# Patient Record
Sex: Female | Born: 1935 | Race: White | Hispanic: No | Marital: Married | Smoking: Never smoker
Health system: Southern US, Community
[De-identification: ages and names within clinical notes are randomized; demographics above are authoritative.]

## PROBLEM LIST (undated history)

## (undated) DIAGNOSIS — I10 Essential (primary) hypertension: Secondary | ICD-10-CM

## (undated) DIAGNOSIS — H409 Unspecified glaucoma: Secondary | ICD-10-CM

## (undated) DIAGNOSIS — F0390 Unspecified dementia without behavioral disturbance: Secondary | ICD-10-CM

## (undated) DIAGNOSIS — K5900 Constipation, unspecified: Secondary | ICD-10-CM

## (undated) DIAGNOSIS — C801 Malignant (primary) neoplasm, unspecified: Secondary | ICD-10-CM

## (undated) DIAGNOSIS — M889 Osteitis deformans of unspecified bone: Secondary | ICD-10-CM

## (undated) HISTORY — PX: HIP ARTHROPLASTY: SHX981

---

## 1997-12-20 ENCOUNTER — Emergency Department (HOSPITAL_COMMUNITY): Admission: EM | Admit: 1997-12-20 | Discharge: 1997-12-20 | Payer: Self-pay | Admitting: Emergency Medicine

## 1999-10-30 ENCOUNTER — Encounter: Payer: Self-pay | Admitting: Orthopedic Surgery

## 1999-10-30 ENCOUNTER — Encounter: Admission: RE | Admit: 1999-10-30 | Discharge: 1999-10-30 | Payer: Self-pay | Admitting: Orthopedic Surgery

## 1999-10-31 ENCOUNTER — Encounter: Admission: RE | Admit: 1999-10-31 | Discharge: 1999-10-31 | Payer: Self-pay | Admitting: Orthopedic Surgery

## 1999-10-31 ENCOUNTER — Encounter: Payer: Self-pay | Admitting: Orthopedic Surgery

## 1999-11-07 ENCOUNTER — Encounter: Payer: Self-pay | Admitting: Orthopedic Surgery

## 1999-11-07 ENCOUNTER — Encounter: Admission: RE | Admit: 1999-11-07 | Discharge: 1999-11-07 | Payer: Self-pay | Admitting: Orthopedic Surgery

## 1999-11-14 ENCOUNTER — Emergency Department (HOSPITAL_COMMUNITY): Admission: EM | Admit: 1999-11-14 | Discharge: 1999-11-14 | Payer: Self-pay | Admitting: Emergency Medicine

## 1999-11-14 ENCOUNTER — Encounter: Payer: Self-pay | Admitting: Emergency Medicine

## 2000-01-28 ENCOUNTER — Encounter: Payer: Self-pay | Admitting: Orthopedic Surgery

## 2000-02-04 ENCOUNTER — Encounter: Payer: Self-pay | Admitting: Orthopedic Surgery

## 2000-02-04 ENCOUNTER — Inpatient Hospital Stay (HOSPITAL_COMMUNITY): Admission: RE | Admit: 2000-02-04 | Discharge: 2000-02-08 | Payer: Self-pay | Admitting: Orthopedic Surgery

## 2000-02-07 ENCOUNTER — Encounter: Payer: Self-pay | Admitting: Orthopedic Surgery

## 2000-02-08 ENCOUNTER — Inpatient Hospital Stay (HOSPITAL_COMMUNITY)
Admission: RE | Admit: 2000-02-08 | Discharge: 2000-02-14 | Payer: Self-pay | Admitting: Physical Medicine & Rehabilitation

## 2003-09-19 ENCOUNTER — Emergency Department (HOSPITAL_COMMUNITY): Admission: EM | Admit: 2003-09-19 | Discharge: 2003-09-19 | Payer: Self-pay | Admitting: Emergency Medicine

## 2007-08-05 ENCOUNTER — Inpatient Hospital Stay (HOSPITAL_COMMUNITY): Admission: RE | Admit: 2007-08-05 | Discharge: 2007-08-11 | Payer: Self-pay | Admitting: Surgery

## 2007-08-05 ENCOUNTER — Encounter (INDEPENDENT_AMBULATORY_CARE_PROVIDER_SITE_OTHER): Payer: Self-pay | Admitting: Surgery

## 2007-08-19 ENCOUNTER — Ambulatory Visit: Payer: Self-pay | Admitting: Hematology and Oncology

## 2007-09-25 ENCOUNTER — Ambulatory Visit (HOSPITAL_COMMUNITY): Admission: RE | Admit: 2007-09-25 | Discharge: 2007-09-25 | Payer: Self-pay | Admitting: Hematology and Oncology

## 2007-09-25 LAB — CBC WITH DIFFERENTIAL/PLATELET
Basophils Absolute: 0.1 10*3/uL (ref 0.0–0.1)
Eosinophils Absolute: 0.1 10*3/uL (ref 0.0–0.5)
HCT: 42.3 % (ref 34.8–46.6)
HGB: 14.7 g/dL (ref 11.6–15.9)
LYMPH%: 19.8 % (ref 14.0–48.0)
MCHC: 34.7 g/dL (ref 32.0–36.0)
MONO#: 0.4 10*3/uL (ref 0.1–0.9)
NEUT%: 72 % (ref 39.6–76.8)
Platelets: 195 10*3/uL (ref 145–400)
WBC: 6.8 10*3/uL (ref 3.9–10.0)
lymph#: 1.4 10*3/uL (ref 0.9–3.3)

## 2007-09-25 LAB — COMPREHENSIVE METABOLIC PANEL
BUN: 14 mg/dL (ref 6–23)
CO2: 28 mEq/L (ref 19–32)
Calcium: 10.7 mg/dL — ABNORMAL HIGH (ref 8.4–10.5)
Chloride: 104 mEq/L (ref 96–112)
Creatinine, Ser: 0.73 mg/dL (ref 0.40–1.20)
Glucose, Bld: 117 mg/dL — ABNORMAL HIGH (ref 70–99)
Total Bilirubin: 0.8 mg/dL (ref 0.3–1.2)

## 2007-10-15 ENCOUNTER — Ambulatory Visit (HOSPITAL_COMMUNITY): Admission: RE | Admit: 2007-10-15 | Discharge: 2007-10-15 | Payer: Self-pay | Admitting: Hematology and Oncology

## 2007-10-19 ENCOUNTER — Ambulatory Visit: Payer: Self-pay | Admitting: Hematology and Oncology

## 2007-10-21 LAB — CBC WITH DIFFERENTIAL/PLATELET
Basophils Absolute: 0 10*3/uL (ref 0.0–0.1)
EOS%: 2.8 % (ref 0.0–7.0)
Eosinophils Absolute: 0.2 10*3/uL (ref 0.0–0.5)
HCT: 40.6 % (ref 34.8–46.6)
HGB: 14.1 g/dL (ref 11.6–15.9)
MCH: 32.9 pg (ref 26.0–34.0)
MONO#: 0.3 10*3/uL (ref 0.1–0.9)
NEUT#: 3.5 10*3/uL (ref 1.5–6.5)
NEUT%: 66 % (ref 39.6–76.8)
RDW: 14.8 % — ABNORMAL HIGH (ref 11.3–14.5)
WBC: 5.4 10*3/uL (ref 3.9–10.0)
lymph#: 1.3 10*3/uL (ref 0.9–3.3)

## 2007-10-21 LAB — COMPREHENSIVE METABOLIC PANEL
AST: 20 U/L (ref 0–37)
Albumin: 4.6 g/dL (ref 3.5–5.2)
BUN: 17 mg/dL (ref 6–23)
CO2: 25 mEq/L (ref 19–32)
Calcium: 10.3 mg/dL (ref 8.4–10.5)
Chloride: 101 mEq/L (ref 96–112)
Creatinine, Ser: 0.81 mg/dL (ref 0.40–1.20)
Glucose, Bld: 169 mg/dL — ABNORMAL HIGH (ref 70–99)
Potassium: 4.5 mEq/L (ref 3.5–5.3)

## 2007-11-12 LAB — CBC WITH DIFFERENTIAL/PLATELET
BASO%: 0.5 % (ref 0.0–2.0)
Basophils Absolute: 0 10*3/uL (ref 0.0–0.1)
EOS%: 2.7 % (ref 0.0–7.0)
HCT: 39.3 % (ref 34.8–46.6)
HGB: 13.9 g/dL (ref 11.6–15.9)
MCH: 34.5 pg — ABNORMAL HIGH (ref 26.0–34.0)
MONO#: 0.4 10*3/uL (ref 0.1–0.9)
RDW: 19.1 % — ABNORMAL HIGH (ref 11.3–14.5)
WBC: 5.7 10*3/uL (ref 3.9–10.0)
lymph#: 1.3 10*3/uL (ref 0.9–3.3)

## 2007-11-12 LAB — COMPREHENSIVE METABOLIC PANEL
ALT: 14 U/L (ref 0–35)
BUN: 20 mg/dL (ref 6–23)
CO2: 20 mEq/L (ref 19–32)
Creatinine, Ser: 0.83 mg/dL (ref 0.40–1.20)
Total Bilirubin: 0.9 mg/dL (ref 0.3–1.2)

## 2007-12-02 LAB — COMPREHENSIVE METABOLIC PANEL
AST: 19 U/L (ref 0–37)
Albumin: 4.5 g/dL (ref 3.5–5.2)
BUN: 14 mg/dL (ref 6–23)
Calcium: 10.4 mg/dL (ref 8.4–10.5)
Chloride: 105 mEq/L (ref 96–112)
Creatinine, Ser: 0.8 mg/dL (ref 0.40–1.20)
Glucose, Bld: 133 mg/dL — ABNORMAL HIGH (ref 70–99)
Potassium: 4.7 mEq/L (ref 3.5–5.3)

## 2007-12-02 LAB — CBC WITH DIFFERENTIAL/PLATELET
Basophils Absolute: 0 10*3/uL (ref 0.0–0.1)
EOS%: 2.2 % (ref 0.0–7.0)
Eosinophils Absolute: 0.1 10*3/uL (ref 0.0–0.5)
HCT: 39.5 % (ref 34.8–46.6)
HGB: 14 g/dL (ref 11.6–15.9)
MCH: 35.9 pg — ABNORMAL HIGH (ref 26.0–34.0)
MCV: 101.5 fL — ABNORMAL HIGH (ref 81.0–101.0)
NEUT#: 5 10*3/uL (ref 1.5–6.5)
NEUT%: 72.1 % (ref 39.6–76.8)
lymph#: 1.3 10*3/uL (ref 0.9–3.3)

## 2007-12-18 ENCOUNTER — Ambulatory Visit: Payer: Self-pay | Admitting: Hematology and Oncology

## 2008-01-12 LAB — CBC WITH DIFFERENTIAL/PLATELET
Basophils Absolute: 0 10*3/uL (ref 0.0–0.1)
Eosinophils Absolute: 0.2 10*3/uL (ref 0.0–0.5)
HCT: 40 % (ref 34.8–46.6)
HGB: 13.9 g/dL (ref 11.6–15.9)
LYMPH%: 18 % (ref 14.0–48.0)
MCV: 105.6 fL — ABNORMAL HIGH (ref 81.0–101.0)
MONO%: 6.8 % (ref 0.0–13.0)
NEUT#: 4.7 10*3/uL (ref 1.5–6.5)
Platelets: 225 10*3/uL (ref 145–400)

## 2008-01-12 LAB — COMPREHENSIVE METABOLIC PANEL
Albumin: 4.6 g/dL (ref 3.5–5.2)
Alkaline Phosphatase: 189 U/L — ABNORMAL HIGH (ref 39–117)
BUN: 26 mg/dL — ABNORMAL HIGH (ref 6–23)
Glucose, Bld: 120 mg/dL — ABNORMAL HIGH (ref 70–99)
Potassium: 4.7 mEq/L (ref 3.5–5.3)
Total Bilirubin: 0.9 mg/dL (ref 0.3–1.2)

## 2008-01-27 ENCOUNTER — Ambulatory Visit: Payer: Self-pay | Admitting: Hematology and Oncology

## 2008-01-29 LAB — CBC WITH DIFFERENTIAL/PLATELET
Basophils Absolute: 0 10*3/uL (ref 0.0–0.1)
Eosinophils Absolute: 0.2 10*3/uL (ref 0.0–0.5)
LYMPH%: 24.2 % (ref 14.0–48.0)
MCH: 36.8 pg — ABNORMAL HIGH (ref 26.0–34.0)
MCV: 106.3 fL — ABNORMAL HIGH (ref 81.0–101.0)
MONO%: 7.1 % (ref 0.0–13.0)
NEUT#: 4.2 10*3/uL (ref 1.5–6.5)
Platelets: 200 10*3/uL (ref 145–400)
RBC: 3.73 10*6/uL (ref 3.70–5.32)

## 2008-01-29 LAB — COMPREHENSIVE METABOLIC PANEL
Alkaline Phosphatase: 171 U/L — ABNORMAL HIGH (ref 39–117)
BUN: 25 mg/dL — ABNORMAL HIGH (ref 6–23)
Glucose, Bld: 117 mg/dL — ABNORMAL HIGH (ref 70–99)
Total Bilirubin: 0.8 mg/dL (ref 0.3–1.2)

## 2008-03-11 ENCOUNTER — Ambulatory Visit (HOSPITAL_COMMUNITY): Admission: RE | Admit: 2008-03-11 | Discharge: 2008-03-11 | Payer: Self-pay | Admitting: Hematology and Oncology

## 2008-03-15 ENCOUNTER — Ambulatory Visit: Payer: Self-pay | Admitting: Hematology and Oncology

## 2008-03-15 LAB — CBC WITH DIFFERENTIAL/PLATELET
BASO%: 0.3 % (ref 0.0–2.0)
EOS%: 2.4 % (ref 0.0–7.0)
Eosinophils Absolute: 0.1 10*3/uL (ref 0.0–0.5)
LYMPH%: 22 % (ref 14.0–49.7)
MCH: 36.5 pg — ABNORMAL HIGH (ref 25.1–34.0)
MCHC: 35.1 g/dL (ref 31.5–36.0)
MCV: 103.9 fL — ABNORMAL HIGH (ref 79.5–101.0)
MONO%: 5.8 % (ref 0.0–14.0)
Platelets: 177 10*3/uL (ref 145–400)
RBC: 3.95 10*6/uL (ref 3.70–5.45)
RDW: 16.2 % — ABNORMAL HIGH (ref 11.2–14.5)

## 2008-03-15 LAB — COMPREHENSIVE METABOLIC PANEL
AST: 26 U/L (ref 0–37)
Alkaline Phosphatase: 186 U/L — ABNORMAL HIGH (ref 39–117)
Glucose, Bld: 115 mg/dL — ABNORMAL HIGH (ref 70–99)
Sodium: 138 mEq/L (ref 135–145)
Total Bilirubin: 0.8 mg/dL (ref 0.3–1.2)
Total Protein: 7.2 g/dL (ref 6.0–8.3)

## 2008-07-12 ENCOUNTER — Ambulatory Visit: Payer: Self-pay | Admitting: Hematology and Oncology

## 2008-07-14 ENCOUNTER — Ambulatory Visit (HOSPITAL_COMMUNITY): Admission: RE | Admit: 2008-07-14 | Discharge: 2008-07-14 | Payer: Self-pay | Admitting: Hematology and Oncology

## 2008-07-14 LAB — CBC WITH DIFFERENTIAL/PLATELET
BASO%: 0.3 % (ref 0.0–2.0)
Eosinophils Absolute: 0.1 10*3/uL (ref 0.0–0.5)
HCT: 42.7 % (ref 34.8–46.6)
LYMPH%: 19.4 % (ref 14.0–49.7)
MCHC: 35.3 g/dL (ref 31.5–36.0)
MONO#: 0.3 10*3/uL (ref 0.1–0.9)
NEUT%: 72.8 % (ref 38.4–76.8)
Platelets: 173 10*3/uL (ref 145–400)
WBC: 6.4 10*3/uL (ref 3.9–10.3)

## 2008-07-14 LAB — COMPREHENSIVE METABOLIC PANEL
ALT: 21 U/L (ref 0–35)
AST: 24 U/L (ref 0–37)
Albumin: 4.4 g/dL (ref 3.5–5.2)
Alkaline Phosphatase: 169 U/L — ABNORMAL HIGH (ref 39–117)
BUN: 17 mg/dL (ref 6–23)
CO2: 25 mEq/L (ref 19–32)
Calcium: 10.2 mg/dL (ref 8.4–10.5)
Glucose, Bld: 102 mg/dL — ABNORMAL HIGH (ref 70–99)
Total Protein: 7.4 g/dL (ref 6.0–8.3)

## 2009-01-31 ENCOUNTER — Ambulatory Visit: Payer: Self-pay | Admitting: Hematology and Oncology

## 2009-01-31 ENCOUNTER — Ambulatory Visit (HOSPITAL_COMMUNITY): Admission: RE | Admit: 2009-01-31 | Discharge: 2009-01-31 | Payer: Self-pay | Admitting: Hematology and Oncology

## 2009-01-31 LAB — CBC WITH DIFFERENTIAL/PLATELET
BASO%: 0.2 % (ref 0.0–2.0)
Basophils Absolute: 0 10*3/uL (ref 0.0–0.1)
Eosinophils Absolute: 0.1 10*3/uL (ref 0.0–0.5)
HCT: 45.1 % (ref 34.8–46.6)
HGB: 15.4 g/dL (ref 11.6–15.9)
LYMPH%: 18.3 % (ref 14.0–49.7)
MCHC: 34.2 g/dL (ref 31.5–36.0)
MONO#: 0.3 10*3/uL (ref 0.1–0.9)
NEUT#: 5.3 10*3/uL (ref 1.5–6.5)
NEUT%: 74.6 % (ref 38.4–76.8)
Platelets: 193 10*3/uL (ref 145–400)
WBC: 7.1 10*3/uL (ref 3.9–10.3)
lymph#: 1.3 10*3/uL (ref 0.9–3.3)

## 2009-01-31 LAB — COMPREHENSIVE METABOLIC PANEL
ALT: 19 U/L (ref 0–35)
CO2: 28 mEq/L (ref 19–32)
Calcium: 10.4 mg/dL (ref 8.4–10.5)
Chloride: 103 mEq/L (ref 96–112)
Creatinine, Ser: 0.88 mg/dL (ref 0.40–1.20)
Glucose, Bld: 117 mg/dL — ABNORMAL HIGH (ref 70–99)

## 2009-01-31 LAB — CEA: CEA: 1 ng/mL (ref 0.0–5.0)

## 2009-08-01 ENCOUNTER — Ambulatory Visit: Payer: Self-pay | Admitting: Hematology and Oncology

## 2009-08-03 ENCOUNTER — Ambulatory Visit (HOSPITAL_COMMUNITY): Admission: RE | Admit: 2009-08-03 | Discharge: 2009-08-03 | Payer: Self-pay | Admitting: Hematology and Oncology

## 2009-08-03 LAB — COMPREHENSIVE METABOLIC PANEL
ALT: 22 U/L (ref 0–35)
AST: 29 U/L (ref 0–37)
Albumin: 4.5 g/dL (ref 3.5–5.2)
Calcium: 10.5 mg/dL (ref 8.4–10.5)
Chloride: 109 mEq/L (ref 96–112)
Potassium: 4.3 mEq/L (ref 3.5–5.3)

## 2009-08-03 LAB — CBC WITH DIFFERENTIAL/PLATELET
BASO%: 0.3 % (ref 0.0–2.0)
Basophils Absolute: 0 10*3/uL (ref 0.0–0.1)
EOS%: 2.6 % (ref 0.0–7.0)
HGB: 14.9 g/dL (ref 11.6–15.9)
MCH: 32.9 pg (ref 25.1–34.0)
MCHC: 35.1 g/dL (ref 31.5–36.0)
MONO#: 0.3 10*3/uL (ref 0.1–0.9)
RDW: 12.7 % (ref 11.2–14.5)
WBC: 5.8 10*3/uL (ref 3.9–10.3)
lymph#: 1.2 10*3/uL (ref 0.9–3.3)

## 2010-01-29 ENCOUNTER — Ambulatory Visit: Payer: Self-pay | Admitting: Hematology and Oncology

## 2010-01-31 LAB — COMPREHENSIVE METABOLIC PANEL
ALT: 23 U/L (ref 0–35)
AST: 27 U/L (ref 0–37)
Albumin: 5 g/dL (ref 3.5–5.2)
Alkaline Phosphatase: 189 U/L — ABNORMAL HIGH (ref 39–117)
BUN: 16 mg/dL (ref 6–23)
CO2: 22 mEq/L (ref 19–32)
Calcium: 10.8 mg/dL — ABNORMAL HIGH (ref 8.4–10.5)
Chloride: 106 mEq/L (ref 96–112)
Creatinine, Ser: 0.83 mg/dL (ref 0.40–1.20)
Glucose, Bld: 116 mg/dL — ABNORMAL HIGH (ref 70–99)
Potassium: 4.7 mEq/L (ref 3.5–5.3)
Sodium: 141 mEq/L (ref 135–145)
Total Bilirubin: 0.7 mg/dL (ref 0.3–1.2)
Total Protein: 7.6 g/dL (ref 6.0–8.3)

## 2010-01-31 LAB — CBC WITH DIFFERENTIAL/PLATELET
BASO%: 0.2 % (ref 0.0–2.0)
Basophils Absolute: 0 10*3/uL (ref 0.0–0.1)
EOS%: 2.8 % (ref 0.0–7.0)
Eosinophils Absolute: 0.2 10*3/uL (ref 0.0–0.5)
HCT: 45.2 % (ref 34.8–46.6)
HGB: 15.4 g/dL (ref 11.6–15.9)
LYMPH%: 23.4 % (ref 14.0–49.7)
MCH: 32.2 pg (ref 25.1–34.0)
MCHC: 34 g/dL (ref 31.5–36.0)
MCV: 94.8 fL (ref 79.5–101.0)
MONO#: 0.4 10*3/uL (ref 0.1–0.9)
MONO%: 5.4 % (ref 0.0–14.0)
NEUT#: 4.5 10*3/uL (ref 1.5–6.5)
NEUT%: 68.2 % (ref 38.4–76.8)
Platelets: 194 10*3/uL (ref 145–400)
RBC: 4.77 10*6/uL (ref 3.70–5.45)
RDW: 13 % (ref 11.2–14.5)
WBC: 6.6 10*3/uL (ref 3.9–10.3)
lymph#: 1.6 10*3/uL (ref 0.9–3.3)

## 2010-01-31 LAB — CEA: CEA: 0.8 ng/mL (ref 0.0–5.0)

## 2010-02-04 ENCOUNTER — Encounter: Payer: Self-pay | Admitting: Hematology and Oncology

## 2010-05-29 NOTE — Discharge Summary (Signed)
[name], [name]               ACCOUNT NO.:  [id number]   MEDICAL RECORD NO.:  [id number]          PATIENT TYPE:  INP   LOCATION:  1530                         FACILITY:  [facility]   PHYSICIAN:  Maisie Fus A. Cornett, M.D.DATE OF BIRTH:  1923/01/14   DATE OF ADMISSION:  08/05/2007  DATE OF DISCHARGE:  08/11/2007                               DISCHARGE SUMMARY   ADMITTING DIAGNOSIS:  Sigmoid colon cancer.   DISCHARGE DIAGNOSIS:  Stage III sigmoid colon cancer.   PROCEDURE PERFORMED:  Laparoscopic-assisted sigmoid colectomy.   BRIEF HISTORY:  The patient is an 77 year old female who was admitted on  August 05, 2007 with history of Paget's disease for a sigmoid colectomy  due to a stage III sigmoid colon cancer.  Please see op note for details  and history and physical.   HOSPITAL COURSE:  The patient did very well.  Her bowel function  returned by postoperative day #2.  Her diet was advanced.  She had some  needs with physical therapy in ambulation and this was worked on during  her hospital stay.  Her hemoglobin remained stable.  She had bowel  function returning and was tolerating her diet.  Her wound was clean,  dry, intact.  Abdomen was soft and nontender.  She was discharged home  on postoperative day #6 in satisfactory condition.  Final pathology was  a T3N1 with 1 of 23 lymph nodes involved on the resection.  Margins were  clear.   DISCHARGE INSTRUCTIONS:  She will follow up with me in 3 weeks.  She  will have home physical therapy and home health aides to help her when  she goes home.  I will make arrangements for an outpatient oncology  visit.  Her diet will be a soft mechanical diet, low fiber, and I will  advance this once I see her back in the office.  She may shower.  No  driving or heavy exertional work until I see her back in 3 weeks.   CONDITION ON DISCHARGE:  Improved.      Thomas A. Cornett, M.D.  Electronically Signed     TAC/MEDQ  D:  08/11/2007  T:   08/11/2007  Job:  [id number]

## 2010-05-29 NOTE — Op Note (Signed)
[name], [name]               ACCOUNT NO.:  [id number]   MEDICAL RECORD NO.:  [id number]          PATIENT TYPE:  INP   LOCATION:  0001                         FACILITY:  [facility]   PHYSICIAN:  Thomas A. Cornett, M.D.DATE OF BIRTH:  05-24-1922   DATE OF PROCEDURE:  08/05/2007  DATE OF DISCHARGE:                               OPERATIVE REPORT   PREOPERATIVE DIAGNOSIS:  Sigmoid colon cancer.   POSTOPERATIVE DIAGNOSIS:  Sigmoid colon cancer.   PROCEDURE:  Laparoscopic-assisted sigmoid colectomy.   SURGEON:  Thomas Cornett.   ASSISTANT:  Glenna Fellows, MD.   ANESTHESIA:  General endotracheal anesthesia, 0.25% Sensorcaine local.   EBL:  30 mL.   SPECIMEN:  Sigmoid colon, large obstructing fungating mass to pathology.   DRAINS:  None.   INDICATIONS FOR PROCEDURE:  The patient is an 77 year old female with a  large obstructing sigmoid colon mass.  She presents today for  laparoscopic-assisted resection of the sigmoid colon mass.  Informed  consent was obtained prior to the procedure in the office.  Please see  office notes for this.   DESCRIPTION OF PROCEDURE:  The patient was brought to the operating room  and placed supine.  After induction of general anesthesia, she was  placed in lithotomy.  Both her extremities were appropriately padded.  She did have a somewhat stiff right hip and right knee, so we were  careful to support this appropriately and try to keep her leg relatively  straight but just enough to have access to the anal canal if needed if a  subsequent stapling device needed to be inserted there.  The left leg  was also kept relatively straight to keep tension off the hip and knee  since she had a hip replacement on the right side and significant  arthritis.  I checked this myself and kept this well padded and I did  not see any undue tension.  I was careful not to put any excessive  tension while in lithotomy.  Both arms were also tucked.  After  induction  of general anesthesia, the abdomen and perineum were prepped  and draped in a sterile fashion.  A 5-mm Optiview was used and access to  the abdominal cavity was gained through the right lower quadrant.  This  was done under direct vision, with no evidence of bowel injury.  We  insufflated 15 mmHg of CO2.  I then placed a second 5-mm port just to  the left of midline in the left upper quadrant.  A third 5-mm port was  placed halfway between the pubic symphysis and the umbilicus under  direct vision.  After achieving insufflation I then was able to examine  the sigmoid colon with the patient rolled to her right and in  Trendelenburg.  The sigmoid colon was extremely redundant.  I was able  to identify the tumor easily and this was in the mid sigmoid colon.  It  was hard and near obstructing it looked like.  I did not see any  significant small bowel dilation.  There was some mild colon dilation  proximal to it.  This area was extremely mobile.  The mesentery was  stretched and it was quite long.  I was able to manipulate the tumor and  actually pulled it all toward the patient's right lower quadrant and  felt this was adequately mobilized, not having to do any significant  mobilization.  At this point, since this was so mobile, I elected to go  ahead and place my abdominal incision to extract this specimen.  I  measured roughly 7 cm through her old scar.  An incision was made in the  lower midline abdomen and I removed the 5-mm port that was there.  We  dissected down through her abdominal wall and placed a wound protector.  I then able to reach and grab the tumor and pull this out quite easily.  There was ample proximal length and there was ample distal length.  I  selected a point approximately 5 cm proximal to the tumor and  approximately 5 cm distal to the tumor and fired a GIA 75 stapling  device across both areas.  The mesentery was quite stretched well away  from the ureter.  A  LigaSure was used to take the mesentery with it down  to the root of the mesentery I felt.  I did not palpate any significant  adenopathy within the mesentery.  Once we resected this, I oriented it  and passed it off the field.  I also placed orienting sutures in the  proximal and distal ends of bowel so we would not get confused and have  the bowel be twisted.  We then performed a side-to-side functional end-  to-end anastomosis of the proximal and distal colon using a GIA 75  stapling device.  The enterotomy was closed using a TA-60.  I oversewed  some bleeding along the staple line with 3-0 silk.  A stitch was placed  in the crotch of the anastomosis of 3-0 silk.  I was able to get at  least get 2 fingerbreadths through the anastomosis and this felt widely  patent.  I then irrigated out the abdominal cavity and dropped the  specimen back.  I then closed the fascia with a running #1 PDS.  I  irrigated the subcutaneous tissues with saline and closed the skin with  staples.  I reinsufflated the abdominal cavity and re-placed my 5-mm  scope.  The liver showed no signs of any mass or evidence of metastatic  disease.  She had no evidence of carcinomatosis and no evidence of  peritoneal studding.  I then with a grasper was able to trace the  proximal colon down to the anastomosis and then to the distal colon.  I  did not see any evidence of where the bowel was twisted or kinked.  It  appeared to be widely patent without any undue tension on the  anastomosis.  There appeared to be no evidence of bleeding.  I irrigated  out the area of the anastomosis and, again, felt no evidence of  bleeding.  This was probably at about 30-40 cm from the anal verge it  looked like.  At this point in time I suctioned out the excess  irrigation.  The anastomosis looked well vascularized and showed no  evidence of tension.  It laid quite nicely and I did not see any  evidence of where this was twisted.  Any excess  irrigation I suctioned  out.  I did not see any evidence of injury to the remainder of the small  bowel nor colon upon inspecting the abdominal cavity.  Her liver, again,  appeared normal without signs of any metastatic disease.  At this point  in time I went ahead and withdrew the laparoscope and allowed the CO2 to  escape.  We then closed the skin incisions with staples.  Dry dressings  were applied.  All final counts of sponge, needle and instruments were  found to be correct at this portion of the case.  The patient is  enrolled in the Ashley study.  All final counts were counted a second  time and found to be correct.  The patient was then awakened and taken  out of lithotomy and taken to recovery in satisfactory condition.      Thomas A. Cornett, M.D.  Electronically Signed     TAC/MEDQ  D:  08/05/2007  T:  08/05/2007  Job:  [id number]   cc:   Gildardo Cranker, MD   Graylin Shiver, M.D.  Fax: [phone]

## 2010-06-01 NOTE — H&P (Signed)
[facility]  Patient:    [name], [name]                        MRN: [id number] Adm. Date:  02/04/00 Attending:  Ollen Gross, M.D. Dictator:   Alexzandrew L. Julien Girt, P.A.-C. CC:         Miguel Aschoff, M.D.             Dr. Janey Greaser at [facility] at [facility]                         History and Physical  DATE OF OFFICE VISIT HISTORY AND PHYSICAL:  January 28, 2000  CHIEF COMPLAINT:  Right hip pain.  HISTORY OF PRESENT ILLNESS:  The patient is a 77 year old female with a known significant history of Pagets disease. She has been seen and evaluated by Ollen Gross, M.D. and also has been evaluated by Altamease Oiler, M.D. for multiple complaints of Pagets disease. She has some Pagets disease in her thoracic spine and also in her hips. The left hip seems to be more effected. However, it is not as symptomatic as the right one. She has been worked up for her back per Dr. Jordan Likes and felt that surgical intervention would not be necessary at this time. He felt the majority of her pain was not located in the lumbar region or the lower thoracic region, and it is felt most of her pain in the low back and hip is more concentrated concerning her right hip pain. She is seen and evaluated by Ollen Gross, M.D. She has been seen and observed to have a moderate antalgic gait. On exam when seen in the office, she has right hip flexion up to 80 degrees. No internal rotation, only about 5 degrees of external rotation. She does have moderate pain with the hip. She does utilize a walker for ambulation. She is felt to have significant osteoarthritis in the right hip which is debilitating at this time. It is felt she would benefit from undergoing a right total hip replacement and arthroplasty. Risks and benefits of this procedure have been discussed with her. She has elected to proceed with surgery.  ALLERGIES:  No known drug allergies.  INTOLERANCES:  DARVOCET causes  nausea, vomiting, and sickness. VIOXX causes GI upset. GLUCOSAMINE causes GI upset.  CURRENT MEDICATIONS: 1. Actonel 30 mg daily. 2. Ultram 50 mg b.i.d. 3. Atenolol 50 mg 1/2 tablet b.i.d. 4. Hydrochlorothiazide 12.5 mg daily. 5. She also takes a multivitamin daily. 6. Os-Cal calcium vitamin D three times a day.  PAST MEDICAL HISTORY:  Pagets disease, hypertension.  PAST SURGICAL HISTORY:  Thyroidectomy, ovarian cyst excision.  SOCIAL HISTORY:  She is married. Has one daughter. Denies use of tobacco products or alcohol products. She lives in a one-story home with four steps entering into her home.  FAMILY HISTORY:  Mother deceased age 46 secondary to stroke. Father deceased age 50 with a stroke.  REVIEW OF SYSTEMS:  GENERAL:  No fevers, chills, or night sweats. NEUROLOGICAL:  No seizures, syncope, or paralysis. RESPIRATORY:  No shortness of breath, productive cough, or hemoptysis. CARDIOVASCULAR:  No chest pain, angina, or orthopnea. She was recently diagnosed with hypertension. Recently started on medications. GI:  No nausea, vomiting, diarrhea. She does have constipation for which she typically uses milk of magnesia over-the-counter. No blood or mucus in the stool. GU:  No discharge, dysuria, or hematuria. MUSCULOSKELETAL:  Pertinent to  that of the right hip found in the history of present illness.  PHYSICAL EXAMINATION:  GENERAL:  The patient is a 77 year old white female, well-nourished, short in stature. She is moderately kyphotic in the thoracic region. On exam, she walks with an antalgic gait and also utilizing a walker. She is alert, oriented, cooperative, very pleasant at time of exam. She is accompanied by her husband.  VITAL SIGNS:  Blood pressure 190/105, pulse 76, respirations 12.  HEENT:  Normocephalic, atraumatic. Pupils are round and reactive. Oropharynx is clear. EOMs are intact.  NECK:  Supple. No carotid bruits are appreciated.  CHEST:  Clear to  auscultation and percussion. No rhonchi or rales.  HEART:  Regular rate and rhythm. No murmurs. S1/S2 noted. No rubs, thrills, palpitations.  ABDOMEN:  Soft abdomen. Bowel sounds are present. No rebound or guarding.  GENITALIA/RECTAL/BREASTS:  Not done. Not pertinent to present illness.  EXTREMITIES:  Significant to the right lower extremity. She has very minimal movement on passive range of motion. Hip flexion approximately 80 degrees. No internal rotation, only 5 degrees of external rotation, only about 20 degrees of abduction. Painful range of motion on exam.  IMPRESSION: 1. Right hip osteoarthritis. 2. Pagets disease. 3. Hypertension.  PLAN:  The patient will be admitted to [facility] to undergo right total hip replacement and arthroplasty. The patients surgery will be performed by Ollen Gross, M.D. Her medical physicians are Dr. Tenny Craw and Dr. Janey Greaser of [facility] out at [facility]. Dr. Tenny Craw and Dr. Janey Greaser will been notified of the room number on admission and will be consulted if needed for any medical assistance, or we will ask for the assistance of the [facility] hospitalist to assist with postoperative care and also management of her hypertension if need be following surgery. DD:  01/28/00 TD:  01/28/00 Job: [id number]

## 2010-08-15 ENCOUNTER — Encounter (HOSPITAL_BASED_OUTPATIENT_CLINIC_OR_DEPARTMENT_OTHER): Payer: Medicare Other | Admitting: Hematology and Oncology

## 2010-08-15 ENCOUNTER — Other Ambulatory Visit: Payer: Self-pay | Admitting: Hematology and Oncology

## 2010-08-15 DIAGNOSIS — R030 Elevated blood-pressure reading, without diagnosis of hypertension: Secondary | ICD-10-CM

## 2010-08-15 DIAGNOSIS — C187 Malignant neoplasm of sigmoid colon: Secondary | ICD-10-CM

## 2010-08-15 LAB — CBC WITH DIFFERENTIAL/PLATELET
BASO%: 0.3 % (ref 0.0–2.0)
LYMPH%: 18.8 % (ref 14.0–49.7)
MCH: 32 pg (ref 25.1–34.0)
MCHC: 33.9 g/dL (ref 31.5–36.0)
MCV: 94.3 fL (ref 79.5–101.0)
MONO%: 5.4 % (ref 0.0–14.0)
Platelets: 182 10*3/uL (ref 145–400)
RBC: 4.7 10*6/uL (ref 3.70–5.45)

## 2010-08-15 LAB — COMPREHENSIVE METABOLIC PANEL
Alkaline Phosphatase: 185 U/L — ABNORMAL HIGH (ref 39–117)
Glucose, Bld: 106 mg/dL — ABNORMAL HIGH (ref 70–99)
Sodium: 137 mEq/L (ref 135–145)
Total Bilirubin: 0.6 mg/dL (ref 0.3–1.2)
Total Protein: 7.5 g/dL (ref 6.0–8.3)

## 2010-10-12 LAB — COMPREHENSIVE METABOLIC PANEL
ALT: 13
AST: 19
Albumin: 3.1 — ABNORMAL LOW
Albumin: 3.8
Alkaline Phosphatase: 114
Alkaline Phosphatase: 159 — ABNORMAL HIGH
BUN: 15
BUN: 8
CO2: 26
Chloride: 105
Chloride: 107
Creatinine, Ser: 0.73
GFR calc non Af Amer: >60
Glucose, Bld: 157 — ABNORMAL HIGH
Potassium: 4.2
Potassium: 4.3
Sodium: 139
Total Bilirubin: 0.6
Total Bilirubin: 0.6

## 2010-10-12 LAB — CBC
HCT: 35.6 — ABNORMAL LOW
HCT: 36.3
HCT: 41.4
Hemoglobin: 12.3
MCV: 91.6
Platelets: 161
Platelets: 180
Platelets: 196
RBC: 4.52
RDW: 13.1
WBC: 11.2 — ABNORMAL HIGH
WBC: 6.3
WBC: 6.6

## 2010-10-12 LAB — BASIC METABOLIC PANEL
BUN: 9
Calcium: 9.5
Creatinine, Ser: 0.7
GFR calc non Af Amer: >60
Glucose, Bld: 96
Potassium: 4.1

## 2010-10-12 LAB — DIFFERENTIAL
Basophils Absolute: 0
Basophils Absolute: 0
Basophils Relative: 0
Basophils Relative: 0
Eosinophils Absolute: 0
Eosinophils Relative: 1
Lymphocytes Relative: 16
Monocytes Absolute: 0.3
Monocytes Absolute: 0.3
Neutro Abs: 10.4 — ABNORMAL HIGH
Neutro Abs: 5.1

## 2010-10-15 LAB — GLUCOSE, CAPILLARY: Glucose-Capillary: 157 — ABNORMAL HIGH

## 2011-02-12 ENCOUNTER — Telehealth: Payer: Self-pay | Admitting: Hematology and Oncology

## 2011-02-12 NOTE — Telephone Encounter (Signed)
S/w the pt and she is aware of her may 2013 appts °

## 2011-04-02 ENCOUNTER — Emergency Department (HOSPITAL_COMMUNITY)
Admission: EM | Admit: 2011-04-02 | Discharge: 2011-04-02 | Disposition: A | Discharge status: Home / Self Care | Payer: Medicare Other | Attending: Emergency Medicine | Admitting: Emergency Medicine

## 2011-04-02 ENCOUNTER — Encounter (HOSPITAL_COMMUNITY): Payer: Self-pay | Admitting: *Deleted

## 2011-04-02 ENCOUNTER — Emergency Department (HOSPITAL_COMMUNITY): Payer: Medicare Other

## 2011-04-02 ENCOUNTER — Other Ambulatory Visit: Payer: Self-pay

## 2011-04-02 DIAGNOSIS — R29898 Other symptoms and signs involving the musculoskeletal system: Secondary | ICD-10-CM | POA: Insufficient documentation

## 2011-04-02 DIAGNOSIS — Z79899 Other long term (current) drug therapy: Secondary | ICD-10-CM | POA: Insufficient documentation

## 2011-04-02 DIAGNOSIS — R1110 Vomiting, unspecified: Secondary | ICD-10-CM

## 2011-04-02 DIAGNOSIS — R112 Nausea with vomiting, unspecified: Secondary | ICD-10-CM | POA: Insufficient documentation

## 2011-04-02 DIAGNOSIS — M889 Osteitis deformans of unspecified bone: Secondary | ICD-10-CM | POA: Insufficient documentation

## 2011-04-02 DIAGNOSIS — J3489 Other specified disorders of nose and nasal sinuses: Secondary | ICD-10-CM | POA: Insufficient documentation

## 2011-04-02 DIAGNOSIS — J189 Pneumonia, unspecified organism: Secondary | ICD-10-CM | POA: Insufficient documentation

## 2011-04-02 DIAGNOSIS — Z7982 Long term (current) use of aspirin: Secondary | ICD-10-CM | POA: Insufficient documentation

## 2011-04-02 DIAGNOSIS — R5381 Other malaise: Secondary | ICD-10-CM | POA: Insufficient documentation

## 2011-04-02 DIAGNOSIS — E860 Dehydration: Secondary | ICD-10-CM | POA: Insufficient documentation

## 2011-04-02 LAB — DIFFERENTIAL
Basophils Absolute: 0 K/uL (ref 0.0–0.1)
Basophils Relative: 0 % (ref 0–1)
Eosinophils Absolute: 0 K/uL (ref 0.0–0.7)
Eosinophils Relative: 0 % (ref 0–5)
Lymphocytes Relative: 7 % — ABNORMAL LOW (ref 12–46)
Lymphs Abs: 0.5 K/uL — ABNORMAL LOW (ref 0.7–4.0)
Monocytes Absolute: 0.5 K/uL (ref 0.1–1.0)
Monocytes Relative: 7 % (ref 3–12)
Neutro Abs: 6.2 K/uL (ref 1.7–7.7)
Neutrophils Relative %: 86 % — ABNORMAL HIGH (ref 43–77)

## 2011-04-02 LAB — URINALYSIS, ROUTINE W REFLEX MICROSCOPIC
Bilirubin Urine: NEGATIVE
Glucose, UA: NEGATIVE mg/dL
Ketones, ur: 15 mg/dL — AB
Protein, ur: NEGATIVE mg/dL
pH: 5 (ref 5.0–8.0)

## 2011-04-02 LAB — COMPREHENSIVE METABOLIC PANEL WITH GFR
ALT: 8 U/L (ref 0–35)
AST: 18 U/L (ref 0–37)
Albumin: 4.4 g/dL (ref 3.5–5.2)
Alkaline Phosphatase: 207 U/L — ABNORMAL HIGH (ref 39–117)
BUN: 19 mg/dL (ref 6–23)
CO2: 22 meq/L (ref 19–32)
Calcium: 10.7 mg/dL — ABNORMAL HIGH (ref 8.4–10.5)
Chloride: 102 meq/L (ref 96–112)
Creatinine, Ser: 0.88 mg/dL (ref 0.50–1.10)
GFR calc Af Amer: 67 mL/min — ABNORMAL LOW
GFR calc non Af Amer: 58 mL/min — ABNORMAL LOW
Glucose, Bld: 125 mg/dL — ABNORMAL HIGH (ref 70–99)
Potassium: 4.3 meq/L (ref 3.5–5.1)
Sodium: 137 meq/L (ref 135–145)
Total Bilirubin: 0.6 mg/dL (ref 0.3–1.2)
Total Protein: 7.7 g/dL (ref 6.0–8.3)

## 2011-04-02 LAB — CBC
Hemoglobin: 14.3 g/dL (ref 12.0–15.0)
Platelets: 162 10*3/uL (ref 150–400)
RBC: 4.53 MIL/uL (ref 3.87–5.11)
WBC: 7.2 10*3/uL (ref 4.0–10.5)

## 2011-04-02 LAB — URINE MICROSCOPIC-ADD ON

## 2011-04-02 LAB — POCT I-STAT TROPONIN I

## 2011-04-02 LAB — LIPASE, BLOOD: Lipase: 26 U/L (ref 11–59)

## 2011-04-02 MED ORDER — ONDANSETRON HCL 4 MG/2ML IJ SOLN
4.0000 mg | INTRAMUSCULAR | Status: DC | PRN
  Administered 2011-04-02: 4 mg via INTRAVENOUS
  Filled 2011-04-02: qty 2

## 2011-04-02 MED ORDER — SODIUM CHLORIDE 0.9 % IV SOLN: INTRAVENOUS | Status: DC

## 2011-04-02 MED ORDER — ONDANSETRON HCL 4 MG PO TABS: 4.0000 mg | ORAL_TABLET | Freq: Four times a day (QID) | ORAL | Status: AC

## 2011-04-02 MED ORDER — AZITHROMYCIN 250 MG PO TABS: ORAL_TABLET | ORAL | Status: AC

## 2011-04-02 MED ORDER — SODIUM CHLORIDE 0.9 % IV BOLUS (SEPSIS)
750.0000 mL | INTRAVENOUS | Status: AC
  Administered 2011-04-02: 19:00:00 via INTRAVENOUS

## 2011-04-02 NOTE — ED Notes (Signed)
MD at bedside. 

## 2011-04-02 NOTE — ED Provider Notes (Cosign Needed)
History     CSN: [id number]  Arrival date & time 04/02/11  1658   First MD Initiated Contact with Patient 04/02/11 1805      Chief Complaint  Patient presents with  . Weakness  . Nausea   Level V caveat for poor historian  (Consider location/radiation/quality/duration/timing/severity/associated sxs/prior treatment) HPI  Patient relates she's had nausea and vomiting without diarrhea since yesterday. She denies abdominal pain. She states she feels weak and today she couldn't walk because she was so weak. She denies cough but has had rhinorrhea. She states her husband has had a cold and he is noted to be coughing during our interview. She states she has chronic weakness in her left arm and leg that started 25 years ago she states her arm and leg were nontender they felt heavy. She states she can walk unassisted in the house but when she leaves the house uses a cane. She also has a history of Paget's disease  PCP Dr. Donovan Kail  History reviewed. No pertinent past medical history. Paget's disease Hypertension  History reviewed. No pertinent past surgical history.  History reviewed. No pertinent family history.  History  Substance Use Topics  . Smoking status: No  . Smokeless tobacco: Not on file  . Alcohol Use: No   lives with spouse Lives at home Is as a cane  OB History    Grav Para Term Preterm Abortions TAB SAB Ect Mult Living                  Review of Systems  All other systems reviewed and are negative.    Allergies  Percocet  Home Medications   Current Outpatient Rx  Name Route Sig Dispense Refill  . ASPIRIN BUFFERED 325 MG PO TABS Oral Take 325 mg by mouth daily.    Marland Kitchen LATANOPROST 0.005 % OP SOLN Both Eyes Place 1 drop into both eyes.    Marland Kitchen LISINOPRIL 40 MG PO TABS Oral Take 40 mg by mouth daily.    . MULTIVITAMINS PO TABS Oral Take 1 tablet by mouth daily.    Marland Kitchen POLYETHYLENE GLYCOL 3350 PO PACK Oral Take 17 g by mouth as needed. constipation    .  RISEDRONATE SODIUM 35 MG PO TABS Oral Take 35 mg by mouth every 7 (seven) days. with water on empty stomach, nothing by mouth or lie down for next 30 minutes.    . TRAMADOL HCL 50 MG PO TABS Oral Take 50 mg by mouth every 6 (six) hours as needed. pain      BP 92/55  Pulse 123  Temp(Src) 99.9 F (37.7 C) (Oral)  Resp 20  SpO2 100%  Vital signs normal except for hypotension and tachycardia with low-grade fever   Physical Exam  Constitutional: She is oriented to person, place, and time.  Non-toxic appearance. She does not appear ill. No distress.       Small frail elderly female  HENT:  Head: Normocephalic and atraumatic.  Right Ear: External ear normal.  Left Ear: External ear normal.  Nose: Nose normal. No mucosal edema or rhinorrhea.  Mouth/Throat: Mucous membranes are normal. No dental abscesses or uvula swelling.       His membranes dry  Eyes: Conjunctivae and EOM are normal. Pupils are equal, round, and reactive to light.  Neck: Normal range of motion and full passive range of motion without pain. Neck supple.  Cardiovascular: Normal rate, regular rhythm and normal heart sounds.  Exam reveals no gallop and  no friction rub.   No murmur heard. Pulmonary/Chest: Effort normal and breath sounds normal. No respiratory distress. She has no wheezes. She has no rhonchi. She has no rales. She exhibits no tenderness and no crepitus.  Abdominal: Soft. Normal appearance and bowel sounds are normal. She exhibits no distension. There is no tenderness. There is no rebound and no guarding.  Musculoskeletal: Normal range of motion. She exhibits no edema and no tenderness.       Moves all extremities well.   Neurological: She is alert and oriented to person, place, and time. She has normal strength. No cranial nerve deficit.  Skin: Skin is warm, dry and intact. No rash noted. No erythema. No pallor.  Psychiatric: She has a normal mood and affect. Her speech is normal and behavior is normal. Her  mood appears not anxious.    ED Course  Procedures (including critical care time)  Patient was wanting to leave as soon as  she was placed in the room. I saw the patient and ordered IV fluids and nausea medicine. At time of discharge patient was ambulatory to the bathroom without assistance. She states she wants to go home.   Results for orders placed during the hospital encounter of 04/02/11  CBC      Component Value Range   WBC 7.2  4.0 - 10.5 (K/uL)   RBC 4.53  3.87 - 5.11 (MIL/uL)   Hemoglobin 14.3  12.0 - 15.0 (g/dL)   HCT 04.5  40.9 - 81.1 (%)   MCV 90.5  78.0 - 100.0 (fL)   MCH 31.6  26.0 - 34.0 (pg)   MCHC 34.9  30.0 - 36.0 (g/dL)   RDW 91.4  78.2 - 95.6 (%)   Platelets 162  150 - 400 (K/uL)  DIFFERENTIAL      Component Value Range   Neutrophils Relative 86 (*) 43 - 77 (%)   Neutro Abs 6.2  1.7 - 7.7 (K/uL)   Lymphocytes Relative 7 (*) 12 - 46 (%)   Lymphs Abs 0.5 (*) 0.7 - 4.0 (K/uL)   Monocytes Relative 7  3 - 12 (%)   Monocytes Absolute 0.5  0.1 - 1.0 (K/uL)   Eosinophils Relative 0  0 - 5 (%)   Eosinophils Absolute 0.0  0.0 - 0.7 (K/uL)   Basophils Relative 0  0 - 1 (%)   Basophils Absolute 0.0  0.0 - 0.1 (K/uL)  COMPREHENSIVE METABOLIC PANEL      Component Value Range   Sodium 137  135 - 145 (mEq/L)   Potassium 4.3  3.5 - 5.1 (mEq/L)   Chloride 102  96 - 112 (mEq/L)   CO2 22  19 - 32 (mEq/L)   Glucose, Bld 125 (*) 70 - 99 (mg/dL)   BUN 19  6 - 23 (mg/dL)   Creatinine, Ser 2.13  0.50 - 1.10 (mg/dL)   Calcium 08.6 (*) 8.4 - 10.5 (mg/dL)   Total Protein 7.7  6.0 - 8.3 (g/dL)   Albumin 4.4  3.5 - 5.2 (g/dL)   AST 18  0 - 37 (U/L)   ALT 8  0 - 35 (U/L)   Alkaline Phosphatase 207 (*) 39 - 117 (U/L)   Total Bilirubin 0.6  0.3 - 1.2 (mg/dL)   GFR calc non Af Amer 58 (*) >90 (mL/min)   GFR calc Af Amer 67 (*) >90 (mL/min)  LIPASE, BLOOD      Component Value Range   Lipase 26  11 - 59 (U/L)  POCT  I-STAT TROPONIN I      Component Value Range   Troponin i, poc  0.06  0.00 - 0.08 (ng/mL)   Comment 3            Laboratory interpretation all normal    Dg Chest Port 1 View  04/02/2011  *RADIOLOGY REPORT*  Clinical Data: Weakness, cough, hypertension  PORTABLE CHEST - 1 VIEW  Comparison: Portable exam 1855 hours compared to 08/03/2007 Correlation:  CT chest 08/03/2009  Findings: Mild enlargement of cardiac silhouette. Tortuous aorta. Pulmonary vascular congestion. Emphysematous and minimal bronchitic changes. Increased interstitial markings versus previous exam, question minimal superimposed infiltrate or failure. No gross pleural effusion or pneumothorax. Bones appear demineralized. Kyphoscoliosis thoracic spine. Right glenohumeral degenerative changes. Chronic changes of the proximal left humerus most consistent with Paget's disease.  IMPRESSION: Enlargement of cardiac silhouette. Emphysematous and bronchitic changes with diffusely increased interstitial markings versus previous exam, question mild superimposed edema or infiltrate.  Original Report Authenticated By: Lollie Marrow, M.D.    Date: 04/02/2011  Rate: 112  Rhythm: sinus tachycardia  QRS Axis: normal  Intervals: normal  ST/T Wave abnormalities: normal  Conduction Disutrbances:none  Narrative Interpretation:   Old EKG Reviewed: changes noted from 08/03/2007 the rate is 85      1. Vomiting   2. Dehydration   3. Community acquired pneumonia    New Prescriptions   AZITHROMYCIN (ZITHROMAX Z-PAK) 250 MG TABLET    Take 2 po the first day then once a day for the next 4 days.   ONDANSETRON (ZOFRAN) 4 MG TABLET    Take 1 tablet (4 mg total) by mouth every 6 (six) hours.   Plan discharge Devoria Albe, MD, FACEP    MDM          Ward Givens, MD 04/02/11 [phone]

## 2011-04-02 NOTE — ED Notes (Signed)
Patient ambulated to restroom with minimal assistance. Patient getting dressed. Requesting to leave right away. Dr. Lynelle Doctor aware. Awaiting discharge instructions.

## 2011-04-02 NOTE — ED Notes (Signed)
Pt in c/o generalized weakness and nausea since this am, increased trouble walking, denies vomiting or diarrhea

## 2011-04-02 NOTE — Discharge Instructions (Signed)
Your xray looks like you may have a pneumonia. Get the antibiotic filled tonight. Drink plenty of fluids (clear liquids) the next 12-24 hours then start the BRAT diet. . Use the zofran for nausea or vomiting.   Recheck if you get worse, such as struggle to breathe, get a high fever or have continued vomiting.  Have Dr Tenny Craw recheck you in the next 2-3 days.

## 2011-05-10 ENCOUNTER — Telehealth: Payer: Self-pay | Admitting: Hematology and Oncology

## 2011-05-10 NOTE — Telephone Encounter (Signed)
Moved 5/14 appt to 5/10 @ 3 pm per LO - call day. S/w pt she is aware. Also confirmed 5/7 appt.

## 2011-05-21 ENCOUNTER — Other Ambulatory Visit (HOSPITAL_BASED_OUTPATIENT_CLINIC_OR_DEPARTMENT_OTHER): Payer: Medicare Other | Admitting: Lab

## 2011-05-21 DIAGNOSIS — C187 Malignant neoplasm of sigmoid colon: Secondary | ICD-10-CM

## 2011-05-21 LAB — CBC WITH DIFFERENTIAL/PLATELET
BASO%: 0.8 % (ref 0.0–2.0)
EOS%: 3.1 % (ref 0.0–7.0)
MCH: 31.7 pg (ref 25.1–34.0)
MCHC: 33.9 g/dL (ref 31.5–36.0)
MONO#: 0.4 10*3/uL (ref 0.1–0.9)
RBC: 4.18 10*6/uL (ref 3.70–5.45)
RDW: 13.1 % (ref 11.2–14.5)
WBC: 6.1 10*3/uL (ref 3.9–10.3)
lymph#: 1.5 10*3/uL (ref 0.9–3.3)
nRBC: 0 % (ref 0–0)

## 2011-05-22 LAB — COMPREHENSIVE METABOLIC PANEL
ALT: <8 U/L (ref 0–35)
AST: 16 U/L (ref 0–37)
Albumin: 4.4 g/dL (ref 3.5–5.2)
Alkaline Phosphatase: 181 U/L — ABNORMAL HIGH (ref 39–117)
BUN: 31 mg/dL — ABNORMAL HIGH (ref 6–23)
Potassium: 4.9 mEq/L (ref 3.5–5.3)
Sodium: 138 mEq/L (ref 135–145)
Total Protein: 7 g/dL (ref 6.0–8.3)

## 2011-05-22 LAB — CEA: CEA: 0.6 ng/mL (ref 0.0–5.0)

## 2011-05-24 ENCOUNTER — Encounter: Payer: Self-pay | Admitting: Hematology and Oncology

## 2011-05-24 ENCOUNTER — Ambulatory Visit (HOSPITAL_BASED_OUTPATIENT_CLINIC_OR_DEPARTMENT_OTHER): Payer: Medicare Other | Admitting: Hematology and Oncology

## 2011-05-24 VITALS — BP 147/89 | HR 61 | Temp 97.1°F | Ht 59.0 in | Wt 107.8 lb

## 2011-05-24 DIAGNOSIS — C189 Malignant neoplasm of colon, unspecified: Secondary | ICD-10-CM | POA: Insufficient documentation

## 2011-05-24 NOTE — Progress Notes (Signed)
This office note has been dictated.

## 2011-05-24 NOTE — Patient Instructions (Signed)
[name]  [id number]  [facility] Discharge Instructions  RECOMMENDATIONS MADE BY THE CONSULTANT AND ANY TEST RESULTS WILL BE SENT TO YOUR REFERRING DOCTOR.   EXAM FINDINGS BY MD TODAY AND SIGNS AND SYMPTOMS TO REPORT TO CLINIC OR PRIMARY MD:   Your current list of medications are: Current Outpatient Prescriptions  Medication Sig Dispense Refill  . aspirin 325 MG buffered tablet Take 325 mg by mouth daily.      Marland Kitchen latanoprost (XALATAN) 0.005 % ophthalmic solution Place 1 drop into both eyes at bedtime.       Marland Kitchen lisinopril (PRINIVIL,ZESTRIL) 40 MG tablet Take 40 mg by mouth daily.      . multivitamin (THERAGRAN) per tablet Take 1 tablet by mouth daily.      . polyethylene glycol (MIRALAX / GLYCOLAX) packet Take 17 g by mouth as needed. constipation      . risedronate (ACTONEL) 35 MG tablet Take 35 mg by mouth every 7 (seven) days. with water on empty stomach, nothing by mouth or lie down for next 30 minutes.      . traMADol (ULTRAM) 50 MG tablet Take 50 mg by mouth every 6 (six) hours as needed. pain         INSTRUCTIONS GIVEN AND DISCUSSED:   SPECIAL INSTRUCTIONS/FOLLOW-UP:  See above.  I acknowledge that I have been informed and understand all the instructions given to me and received a copy. I do not have any more questions at this time, but understand that I may call the [facility] at [phone] during business hours should I have any further questions or need assistance in obtaining follow-up care.

## 2011-05-28 ENCOUNTER — Ambulatory Visit: Payer: Medicare Other | Admitting: Hematology and Oncology

## 2011-05-28 NOTE — Progress Notes (Signed)
CC:   Magnus Sinning) Tenny Craw, M.D.  IDENTIFYING STATEMENT:  The patient is an 78 year old woman with colon cancer, who presents for followup.  INTERVAL HISTORY:  The patient was last seen 9 months ago.  The patient reports she is doing as best she can.  With her husband, has no difficulty completing ADLs.  Appetite is fair.  She does get occasional constipation but relieved with OTC laxatives.  She denies pain.  She has good appetite.  MEDICINES:  Reviewed and updated.  ALLERGIES:  Percocet.  PHYSICAL EXAMINATION:  Alert oriented x3.  Vitals:  Pulse 61, blood pressure 147/89, temperature 97.1, respirations 20, weight 107 pounds. HEENT:  Head is atraumatic, normocephalic.  Sclerae anicteric.  Mouth moist.  Chest, CVS:  Unremarkable.  Abdomen:  Soft and nontender.  Bowel sounds present.  Extremities:  No edema.  LABORATORY DATA:  05/21/2011:  White cell count 6.1, hemoglobin 13.3, hematocrit 39.2, platelets 211.  Sodium 138, potassium 4.9, chloride 106, CO2 23, BUN 31, creatinine 1.09 glucose 125, t-bili 2.6, alkaline phosphatase 181, AST 16, ALT less than 8, calcium 10.5.  CEA 0.6.  IMPRESSION/PLAN:  Mrs. Tunison is an 78 year old woman diagnosed with node- positive colon cancer on August 05, 2007.  She received 8 cycles of adjuvant Xeloda from September 20, 2007 through February 2010.  Her lab work indicates a normal CEA with a normal liver panel.  She was given the option of continuing surveillance with oncology versus her primary care physician.  She desires the latter.  I advised office visit, Comprehensive metabolic panel, and CEA levels.  She follows up as needed.     ______________________________ Laurice Record, M.D. LIO/MEDQ  D:  05/24/2011  T:  05/24/2011  Job:  [id number]

## 2011-12-19 ENCOUNTER — Encounter (HOSPITAL_COMMUNITY): Payer: Self-pay | Admitting: Emergency Medicine

## 2011-12-19 ENCOUNTER — Emergency Department (HOSPITAL_COMMUNITY)
Admission: EM | Admit: 2011-12-19 | Discharge: 2011-12-19 | Disposition: A | Discharge status: Home / Self Care | Payer: Medicare Other | Attending: Emergency Medicine | Admitting: Emergency Medicine

## 2011-12-19 DIAGNOSIS — K5641 Fecal impaction: Secondary | ICD-10-CM | POA: Insufficient documentation

## 2011-12-19 DIAGNOSIS — Z7982 Long term (current) use of aspirin: Secondary | ICD-10-CM | POA: Insufficient documentation

## 2011-12-19 DIAGNOSIS — R197 Diarrhea, unspecified: Secondary | ICD-10-CM

## 2011-12-19 DIAGNOSIS — E860 Dehydration: Secondary | ICD-10-CM | POA: Insufficient documentation

## 2011-12-19 DIAGNOSIS — Z79899 Other long term (current) drug therapy: Secondary | ICD-10-CM | POA: Insufficient documentation

## 2011-12-19 LAB — CBC
HCT: 36.9 % (ref 36.0–46.0)
Hemoglobin: 13.3 g/dL (ref 12.0–15.0)
RDW: 12.7 % (ref 11.5–15.5)
WBC: 9.7 10*3/uL (ref 4.0–10.5)

## 2011-12-19 LAB — URINALYSIS, ROUTINE W REFLEX MICROSCOPIC
Glucose, UA: NEGATIVE mg/dL
Protein, ur: NEGATIVE mg/dL
pH: 5 (ref 5.0–8.0)

## 2011-12-19 LAB — COMPREHENSIVE METABOLIC PANEL
ALT: 8 U/L (ref 0–35)
Albumin: 4.1 g/dL (ref 3.5–5.2)
Alkaline Phosphatase: 156 U/L — ABNORMAL HIGH (ref 39–117)
BUN: 37 mg/dL — ABNORMAL HIGH (ref 6–23)
Chloride: 100 mEq/L (ref 96–112)
GFR calc Af Amer: 46 mL/min — ABNORMAL LOW (ref >90)
Glucose, Bld: 129 mg/dL — ABNORMAL HIGH (ref 70–99)
Potassium: 4.4 mEq/L (ref 3.5–5.1)
Sodium: 135 mEq/L (ref 135–145)
Total Bilirubin: 0.7 mg/dL (ref 0.3–1.2)
Total Protein: 7 g/dL (ref 6.0–8.3)

## 2011-12-19 LAB — URINE MICROSCOPIC-ADD ON

## 2011-12-19 MED ORDER — SODIUM CHLORIDE 0.9 % IV BOLUS (SEPSIS)
500.0000 mL | Freq: Once | INTRAVENOUS | Status: AC
  Administered 2011-12-19: 500 mL via INTRAVENOUS

## 2011-12-19 MED ORDER — FLEET ENEMA 7-19 GM/118ML RE ENEM
1.0000 | ENEMA | Freq: Once | RECTAL | Status: AC
  Administered 2011-12-19: 15:00:00 via RECTAL
  Filled 2011-12-19: qty 1

## 2011-12-19 NOTE — ED Notes (Signed)
MD at bedside. 

## 2011-12-19 NOTE — ED Notes (Signed)
Pt complains of "I'm having diarrhea everywhere" Per report, pt was constipated x 3 days and took milk of magnesia yesterday. Pt now reports to having diarrhea.

## 2011-12-19 NOTE — ED Notes (Signed)
Patient continuously up and down on bed pan. Pt only having small amounts of runny stool. Patient crying and stating I have to go but I can't. Notified Designer, industrial/product.

## 2011-12-19 NOTE — ED Provider Notes (Signed)
History     CSN: [id number]  Arrival date & time 12/19/11  1009   First MD Initiated Contact with Patient 12/19/11 1017      Chief Complaint  Patient presents with  . Diarrhea    (Consider location/radiation/quality/duration/timing/severity/associated sxs/prior treatment) HPI Comments: Mrs. Newgent presents for evaluation of diarrhea.  She reports she was feeling extremely constipated for 3 days so she took milk of magnesia yesterday.  She has been having loose stools since taking the MOM.  She denies any pain but reports she does not feel well.  She has had no fevers and denies vomiting, melena, and hematochezia.  Patient is a 78 y.o. female presenting with diarrhea. The history is provided by the patient. No language interpreter was used.  Diarrhea The primary symptoms include diarrhea. Primary symptoms do not include fever, weight loss, fatigue, abdominal pain, nausea, vomiting, melena, hematemesis, jaundice, hematochezia, dysuria, myalgias or arthralgias. The illness began today. The onset was gradual. The problem has not changed since onset. The illness does not include chills, anorexia, dysphagia, odynophagia, bloating, constipation, tenesmus, back pain or itching.    History reviewed. No pertinent past medical history.  History reviewed. No pertinent past surgical history.  History reviewed. No pertinent family history.  History  Substance Use Topics  . Smoking status: Never Smoker   . Smokeless tobacco: Not on file  . Alcohol Use: No    OB History    Grav Para Term Preterm Abortions TAB SAB Ect Mult Living                  Review of Systems  Constitutional: Negative for fever, chills, weight loss and fatigue.  Gastrointestinal: Positive for diarrhea. Negative for dysphagia, nausea, vomiting, abdominal pain, constipation, melena, hematochezia, bloating, anorexia, hematemesis and jaundice.  Genitourinary: Negative for dysuria.  Musculoskeletal: Negative for  myalgias, back pain and arthralgias.  Skin: Negative for itching.  All other systems reviewed and are negative.    Allergies  Percocet  Home Medications   Current Outpatient Rx  Name  Route  Sig  Dispense  Refill  . ASPIRIN BUFFERED 325 MG PO TABS   Oral   Take 325 mg by mouth daily.         Marland Kitchen LATANOPROST 0.005 % OP SOLN   Both Eyes   Place 1 drop into both eyes at bedtime.          Marland Kitchen LISINOPRIL 40 MG PO TABS   Oral   Take 40 mg by mouth daily.         . MULTIVITAMINS PO TABS   Oral   Take 1 tablet by mouth daily.         Marland Kitchen POLYETHYLENE GLYCOL 3350 PO PACK   Oral   Take 17 g by mouth as needed. constipation         . RISEDRONATE SODIUM 35 MG PO TABS   Oral   Take 35 mg by mouth every 7 (seven) days. with water on empty stomach, nothing by mouth or lie down for next 30 minutes.         . TRAMADOL HCL 50 MG PO TABS   Oral   Take 50 mg by mouth every 6 (six) hours as needed. pain           BP 159/86  Pulse 100  Temp 99.2 F (37.3 C) (Oral)  Resp 16  SpO2 99%  Physical Exam  Nursing note and vitals reviewed. Constitutional: She is oriented to  person, place, and time. She appears well-developed and well-nourished. No distress.  HENT:  Head: Normocephalic and atraumatic.  Right Ear: External ear normal.  Left Ear: External ear normal.  Nose: Nose normal.  Mouth/Throat: Oropharynx is clear and moist. No oropharyngeal exudate.  Eyes: Conjunctivae normal are normal. Pupils are equal, round, and reactive to light. Right eye exhibits no discharge. Left eye exhibits no discharge. No scleral icterus.  Neck: Normal range of motion. Neck supple. No JVD present. No tracheal deviation present.  Cardiovascular: Normal rate, regular rhythm, normal heart sounds and intact distal pulses.  Exam reveals no gallop and no friction rub.   No murmur heard. Pulmonary/Chest: Effort normal and breath sounds normal. No stridor. No respiratory distress. She has no  wheezes. She has no rales. She exhibits no tenderness.  Abdominal: Soft. She exhibits no distension and no mass. Bowel sounds are increased. There is no tenderness. There is no rebound and no guarding.  Musculoskeletal: Normal range of motion. She exhibits no edema and no tenderness.       Note dried feces on pt's feet.  Lymphadenopathy:    She has no cervical adenopathy.  Neurological: She is alert and oriented to person, place, and time.  Skin: Skin is warm and dry. No rash noted. She is not diaphoretic. No erythema. No pallor.  Psychiatric: She has a normal mood and affect. Her behavior is normal.    ED Course  Procedures (including critical care time)   Labs Reviewed  CBC  COMPREHENSIVE METABOLIC PANEL  URINALYSIS, ROUTINE W REFLEX MICROSCOPIC   No results found.   No diagnosis found.    MDM  Pt presents for evaluation of diarrhea.  She appears comfortable, note stable VS, NAD.  Will observe in ER, obtain orthostatic VS, and basic belly labs.  She has no evidence of a surgical abdomen.    1630.  Pt stable, NAD.  Rectal exam performed.  Noted some hard stool that was passed after an enema.  She has some reducible rectal prolapse and hemorrhoids that are not thrombosed.  Suspect she had diarrhea after the MOM because of some fecal impaction with loose stool moving around the hardened stool.  Pt appears comfortable, Note stable VS.  Plan D/C home to f/u with her PMD.  Administered IVF also.      Tobin Chad, MD 12/19/11 [phone]

## 2011-12-19 NOTE — ED Notes (Signed)
Pt cleaned and new linens applied to pts bed.

## 2012-01-31 ENCOUNTER — Encounter (HOSPITAL_COMMUNITY): Payer: Self-pay | Admitting: Emergency Medicine

## 2012-01-31 ENCOUNTER — Emergency Department (HOSPITAL_COMMUNITY)
Admission: EM | Admit: 2012-01-31 | Discharge: 2012-01-31 | Disposition: A | Discharge status: Home / Self Care | Payer: Medicare Other | Attending: Emergency Medicine | Admitting: Emergency Medicine

## 2012-01-31 DIAGNOSIS — K625 Hemorrhage of anus and rectum: Secondary | ICD-10-CM | POA: Insufficient documentation

## 2012-01-31 DIAGNOSIS — Z8739 Personal history of other diseases of the musculoskeletal system and connective tissue: Secondary | ICD-10-CM | POA: Insufficient documentation

## 2012-01-31 DIAGNOSIS — K921 Melena: Secondary | ICD-10-CM | POA: Insufficient documentation

## 2012-01-31 DIAGNOSIS — K644 Residual hemorrhoidal skin tags: Secondary | ICD-10-CM

## 2012-01-31 DIAGNOSIS — Z7982 Long term (current) use of aspirin: Secondary | ICD-10-CM | POA: Insufficient documentation

## 2012-01-31 HISTORY — DX: Osteitis deformans of unspecified bone: M88.9

## 2012-01-31 LAB — OCCULT BLOOD, POC DEVICE: Fecal Occult Bld: NEGATIVE

## 2012-01-31 MED ORDER — HYDROCORTISONE 2.5 % RE CREA: TOPICAL_CREAM | RECTAL | Status: DC

## 2012-01-31 NOTE — ED Notes (Signed)
Pt verbalizes understanding 

## 2012-01-31 NOTE — ED Notes (Signed)
ptar called for pt transport back to facility

## 2012-01-31 NOTE — ED Notes (Signed)
[id number]<AV> Expected date:<BR> Expected time:<BR> Means of arrival:<BR> Comments:<BR> Environmental to Zap

## 2012-01-31 NOTE — ED Notes (Signed)
Pt in from [facility] c/o diarrhea and constipation. Pt denies abd pain, N/V.

## 2012-01-31 NOTE — ED Notes (Signed)
Pt in from home c/o diarrhea x2 days. Pt denies abd pain, CP, SOB, N/V. Pt has been eating/drinking normally. Pt in NAD and A&Ox4.

## 2012-02-01 NOTE — ED Provider Notes (Signed)
History     CSN: [id number]  Arrival date & time 01/31/12  1735   First MD Initiated Contact with Patient 01/31/12 1853      Chief Complaint  Patient presents with  . Diarrhea  . Constipation    (Consider location/radiation/quality/duration/timing/severity/associated sxs/prior treatment) HPI Comments: This patient presents here for evaluation of bloody stool.  She has had some diarrhea recently and noticed bright red blood when she wiped.  She denies any fevers or chills.  No abdominal or rectal pain.  She denies to me having passed any hard stool or having to strain to have a bowel movement.  There are no aggravating or alleviating factors.  Patient is a 79 y.o. female presenting with hematochezia. The history is provided by the patient and the spouse.  Rectal Bleeding  The current episode started today. The onset was sudden. The problem occurs rarely. The problem has been unchanged. The patient is experiencing no pain. The stool is described as soft. There was no prior successful therapy. There was no prior unsuccessful therapy. Associated symptoms include diarrhea. Pertinent negatives include no fever and no nausea.    Past Medical History  Diagnosis Date  . Paget's bone disease     Past Surgical History  Procedure Date  . Hip arthroplasty     History reviewed. No pertinent family history.  History  Substance Use Topics  . Smoking status: Never Smoker   . Smokeless tobacco: Not on file  . Alcohol Use: No    OB History    Grav Para Term Preterm Abortions TAB SAB Ect Mult Living                  Review of Systems  Constitutional: Negative for fever.  Gastrointestinal: Positive for diarrhea and hematochezia. Negative for nausea.  All other systems reviewed and are negative.    Allergies  Percocet  Home Medications   Current Outpatient Rx  Name  Route  Sig  Dispense  Refill  . ACETAMINOPHEN 325 MG PO TABS   Oral   Take 325 mg by mouth daily at 8 pm.           . ASPIRIN BUFFERED 325 MG PO TABS   Oral   Take 325 mg by mouth daily.         Marland Kitchen HYDROCHLOROTHIAZIDE 12.5 MG PO CAPS   Oral   Take 12.5 mg by mouth daily.         Marland Kitchen LATANOPROST 0.005 % OP SOLN   Both Eyes   Place 1 drop into both eyes at bedtime.          Marland Kitchen LISINOPRIL 40 MG PO TABS   Oral   Take 40 mg by mouth daily.         Marland Kitchen METOPROLOL TARTRATE 25 MG PO TABS   Oral   Take 25-50 mg by mouth 2 (two) times daily. Take 2 tablets in the morning and 1 tablet in the evening         . MULTIVITAMINS PO TABS   Oral   Take 1 tablet by mouth daily.         Marland Kitchen ONDANSETRON HCL 4 MG PO TABS   Oral   Take 4 mg by mouth every 8 (eight) hours as needed. Nausea         . RISEDRONATE SODIUM 35 MG PO TABS   Oral   Take 35 mg by mouth every 7 (seven) days. with water on empty stomach, nothing  by mouth or lie down for next 30 minutes.         . TRAMADOL HCL 50 MG PO TABS   Oral   Take 50 mg by mouth every 6 (six) hours as needed. pain         . HYDROCORTISONE 2.5 % RE CREA      Apply rectally 2 times daily   30 g   1     BP 152/84  Pulse 74  Temp 98.1 F (36.7 C) (Oral)  Resp 20  SpO2 96%  Physical Exam  Nursing note and vitals reviewed. Constitutional: She is oriented to person, place, and time. She appears well-developed and well-nourished. No distress.  HENT:  Head: Normocephalic and atraumatic.  Neck: Normal range of motion. Neck supple.  Cardiovascular: Normal rate and regular rhythm.  Exam reveals no gallop and no friction rub.   No murmur heard. Pulmonary/Chest: Effort normal and breath sounds normal. No respiratory distress. She has no wheezes.  Abdominal: Soft. Bowel sounds are normal. She exhibits no distension. There is no tenderness.  Genitourinary: Guaiac negative stool.       The rectal exam reveals several medium sized hemorrhoids but no active bleeding.  There are no palpable masses.    Musculoskeletal: Normal range of motion.   Neurological: She is alert and oriented to person, place, and time.  Skin: Skin is warm and dry. She is not diaphoretic.    ED Course  Procedures (including critical care time)   Labs Reviewed  OCCULT BLOOD, POC DEVICE  LAB REPORT - SCANNED   No results found.   1. Rectal bleeding   2. External hemorrhoid       MDM  The patient presents for eval of blood on toilet paper when wiping.  She says she feels fine and has no complaints and it appears as though her husband is more concerned than she is.  She was ready to go home just as soon as she arrived to the ED.  She did not want any tests performed and was wanting to get dressed.  I was able to convince her to allow me to inspect her rectal area which did reveal hemorrhoids but heme negative stools.  She refused to allow me to do any blood work.  She appears well and assures me that if she worsens, she will return to the ED.  I have prescribed her anusol for the hemorrhoids as I suspect that this is the cause of the blood on the toilet paper.          Geoffery Lyons, MD 02/01/12 1001

## 2012-03-27 ENCOUNTER — Telehealth: Payer: Self-pay | Admitting: Hematology and Oncology

## 2012-03-27 NOTE — Telephone Encounter (Signed)
Pt's dtr lmonvm re a letter received about LO leaving practice and wanted [place] to call her re any appts her mom needed. Pt was last seen 05/24/11 and no f/u orders were given. Per 05/24/11 note pt opted to f/u w/primary and no pof was sent. Returned dtr's call [phone]) and lm asking that she call me.

## 2012-03-29 ENCOUNTER — Emergency Department (HOSPITAL_COMMUNITY): Payer: Medicare Other

## 2012-03-29 ENCOUNTER — Encounter (HOSPITAL_COMMUNITY): Payer: Self-pay | Admitting: Emergency Medicine

## 2012-03-29 ENCOUNTER — Emergency Department (HOSPITAL_COMMUNITY)
Admission: EM | Admit: 2012-03-29 | Discharge: 2012-03-29 | Disposition: A | Discharge status: Home / Self Care | Payer: Medicare Other | Attending: Emergency Medicine | Admitting: Emergency Medicine

## 2012-03-29 DIAGNOSIS — K5900 Constipation, unspecified: Secondary | ICD-10-CM

## 2012-03-29 DIAGNOSIS — Z7982 Long term (current) use of aspirin: Secondary | ICD-10-CM | POA: Insufficient documentation

## 2012-03-29 DIAGNOSIS — Z79899 Other long term (current) drug therapy: Secondary | ICD-10-CM | POA: Insufficient documentation

## 2012-03-29 DIAGNOSIS — M889 Osteitis deformans of unspecified bone: Secondary | ICD-10-CM | POA: Insufficient documentation

## 2012-03-29 DIAGNOSIS — F0390 Unspecified dementia without behavioral disturbance: Secondary | ICD-10-CM | POA: Insufficient documentation

## 2012-03-29 LAB — COMPREHENSIVE METABOLIC PANEL
ALT: 16 U/L (ref 0–35)
AST: 21 U/L (ref 0–37)
Albumin: 4 g/dL (ref 3.5–5.2)
Alkaline Phosphatase: 121 U/L — ABNORMAL HIGH (ref 39–117)
Calcium: 10.5 mg/dL (ref 8.4–10.5)
Potassium: 4.2 mEq/L (ref 3.5–5.1)
Sodium: 132 mEq/L — ABNORMAL LOW (ref 135–145)
Total Protein: 6.9 g/dL (ref 6.0–8.3)

## 2012-03-29 LAB — CBC WITH DIFFERENTIAL/PLATELET
Eosinophils Absolute: 0.1 10*3/uL (ref 0.0–0.7)
Eosinophils Relative: 1 % (ref 0–5)
HCT: 38.4 % (ref 36.0–46.0)
Hemoglobin: 13.4 g/dL (ref 12.0–15.0)
Lymphs Abs: 0.9 10*3/uL (ref 0.7–4.0)
MCH: 31.6 pg (ref 26.0–34.0)
MCV: 90.6 fL (ref 78.0–100.0)
Monocytes Absolute: 0.4 10*3/uL (ref 0.1–1.0)
Monocytes Relative: 5 % (ref 3–12)
Neutrophils Relative %: 83 % — ABNORMAL HIGH (ref 43–77)
RBC: 4.24 MIL/uL (ref 3.87–5.11)

## 2012-03-29 MED ORDER — FLEET ENEMA 7-19 GM/118ML RE ENEM
1.0000 | ENEMA | Freq: Once | RECTAL | Status: AC
  Administered 2012-03-29: 1 via RECTAL
  Filled 2012-03-29: qty 1

## 2012-03-29 MED ORDER — ONDANSETRON HCL 4 MG/2ML IJ SOLN
4.0000 mg | Freq: Once | INTRAMUSCULAR | Status: AC
  Administered 2012-03-29: 4 mg via INTRAVENOUS
  Filled 2012-03-29: qty 2

## 2012-03-29 MED ORDER — SODIUM CHLORIDE 0.9 % IV SOLN
1000.0000 mL | INTRAVENOUS | Status: DC
  Administered 2012-03-29: 1000 mL via INTRAVENOUS

## 2012-03-29 NOTE — ED Provider Notes (Signed)
History     CSN: [id number]  Arrival date & time 03/29/12  1322   First MD Initiated Contact with Patient 03/29/12 1359      Chief Complaint  Patient presents with  . Diarrhea  . Constipation    Level V caveat: Dementia. Patient is a bit confused as to why she is here HPI Patient was picked up from [employer]. She has been having issues with constipation and frequent urge to have a bowel movement today. Caregiver reported that they gave her a dose of milk of magnesia today. She does have history of chronic constipation issues. Instead time she has been having frequent episodes of the urge to have a bowel movement. Patient does not actually think that she is actually passing a lot of stool though. The patient has some mild abdominal cramping. She has not had trouble with vomiting or fever.  She has been seen in emergency room previously for issues with constipation and hemorrhoids. Past Medical History  Diagnosis Date  . Paget's bone disease     Past Surgical History  Procedure Laterality Date  . Hip arthroplasty      No family history on file.  History  Substance Use Topics  . Smoking status: Never Smoker   . Smokeless tobacco: Not on file  . Alcohol Use: No    OB History   Grav Para Term Preterm Abortions TAB SAB Ect Mult Living                  Review of Systems  All other systems reviewed and are negative.    Allergies  Percocet  Home Medications   Current Outpatient Rx  Name  Route  Sig  Dispense  Refill  . aspirin 325 MG buffered tablet   Oral   Take 325 mg by mouth daily.         . hydrochlorothiazide (MICROZIDE) 12.5 MG capsule   Oral   Take 12.5 mg by mouth daily.         . hydrOXYzine (ATARAX/VISTARIL) 10 MG tablet   Oral   Take 5-10 mg by mouth 3 (three) times daily as needed for anxiety.         Marland Kitchen latanoprost (XALATAN) 0.005 % ophthalmic solution   Both Eyes   Place 1 drop into both eyes at bedtime.           . metoprolol succinate (TOPROL-XL) 25 MG 24 hr tablet   Oral   Take 25 mg by mouth at bedtime.         . metoprolol succinate (TOPROL-XL) 50 MG 24 hr tablet   Oral   Take 100 mg by mouth daily. Take with or immediately following a meal.         . Multiple Vitamin (MULTIVITAMIN WITH MINERALS) TABS   Oral   Take 1 tablet by mouth daily.         . polyethylene glycol (MIRALAX / GLYCOLAX) packet   Oral   Take 17 g by mouth daily.         . promethazine (PHENERGAN) 12.5 MG tablet   Oral   Take 12.5 mg by mouth every 6 (six) hours as needed for nausea.         . risedronate (ACTONEL) 35 MG tablet   Oral   Take 35 mg by mouth every 7 (seven) days. with water on empty stomach, nothing by mouth or lie down for next 30 minutes.         Marland Kitchen  traMADol (ULTRAM) 50 MG tablet   Oral   Take 50 mg by mouth every 6 (six) hours as needed. pain           BP 153/86  Pulse 68  Temp(Src) 98.2 F (36.8 C)  Resp 16  SpO2 97%  Physical Exam  Nursing note and vitals reviewed. Constitutional: She appears well-nourished. No distress.  Elderly, frail  HENT:  Head: Normocephalic and atraumatic.  Right Ear: External ear normal.  Left Ear: External ear normal.  Eyes: Conjunctivae are normal. Right eye exhibits no discharge. Left eye exhibits no discharge. No scleral icterus.  Neck: Neck supple. No tracheal deviation present.  Cardiovascular: Normal rate, regular rhythm and intact distal pulses.   Pulmonary/Chest: Effort normal and breath sounds normal. No stridor. No respiratory distress. She has no wheezes. She has no rales.  Abdominal: Soft. Bowel sounds are normal. She exhibits no distension. There is no tenderness. There is no rebound and no guarding.  Active bowel sounds  Genitourinary:  The patient is actively passing gas, external hemorrhoid noted without erythema or bleeding, stool in the rectal vault on digital exam but it is soft and without fecal impaction   Musculoskeletal: She exhibits no edema and no tenderness.  Neurological: She is alert. She has normal strength. No sensory deficit. Cranial nerve deficit:  no gross defecits noted. She exhibits normal muscle tone. She displays no seizure activity. Coordination normal.  Skin: Skin is warm and dry. No rash noted. She is not diaphoretic.  Psychiatric: She has a normal mood and affect.    ED Course  Procedures (including critical care time)  Labs Reviewed  COMPREHENSIVE METABOLIC PANEL - Abnormal; Notable for the following:    Sodium 132 (*)    Glucose, Bld 134 (*)    BUN 29 (*)    Alkaline Phosphatase 121 (*)    GFR calc non Af Amer 62 (*)    GFR calc Af Amer 71 (*)    All other components within normal limits  CBC WITH DIFFERENTIAL - Abnormal; Notable for the following:    Neutrophils Relative 83 (*)    Lymphocytes Relative 11 (*)    All other components within normal limits  LIPASE, BLOOD  URINALYSIS, ROUTINE W REFLEX MICROSCOPIC   No results found.   1. Constipation       MDM  Pt has had a large bowel movement while in the ED after an enema.  She is feeling much better at this time.    I doubt obstruction, acute infection or acute vascular emergency.  Will dc home.  Follow up with PCP.        Celene Kras, MD 03/29/12 [phone]

## 2012-03-29 NOTE — ED Notes (Addendum)
Per telephone conversation with pt's daughter, she is concerned that pt might be dehydrated due to constant diarrhea.  Reports that pt may be confusing constipation with hemorrhoids.  Daughter Sheppard Evens contact information [phone], stated that she is power of attorney.

## 2012-03-29 NOTE — ED Notes (Signed)
QMV:[id number]<NG> Expected date:03/29/12<BR> Expected time: 1:20 PM<BR> Means of arrival:Ambulance<BR> Comments:<BR> Diarrhea

## 2012-03-29 NOTE — ED Notes (Signed)
PER PTAR- pt picked up from [facility] with c/o diarrhea and constipation x1 day.  Caregiver reports giving pt milk of magnesia due to hx of chronic constipation.  Pt reports now that she can't control bowl movements now.  Pt also c/o 5/10 rectal pain. Pt also has hx of hemorrhoids. Denies blood in stool.

## 2012-04-01 ENCOUNTER — Telehealth: Payer: Self-pay | Admitting: Hematology and Oncology

## 2012-04-01 NOTE — Telephone Encounter (Signed)
Returned dtr's call re receiving letter about LO leaving and wanted to know if her mom had any additional f/u w/LO. Returned call and informed dtr  that no further orders were given her pt at her last visit. Per last note from May 2013 pt opted to f/u w/her primary. Per dtr pt will be seeing a new primary next wk. Per dtr she will request records from our office if needed. See 3/14 note.

## 2012-05-07 ENCOUNTER — Emergency Department (HOSPITAL_COMMUNITY): Payer: Medicare Other

## 2012-05-07 ENCOUNTER — Inpatient Hospital Stay (HOSPITAL_COMMUNITY)
Admission: EM | Admit: 2012-05-07 | Discharge: 2012-05-11 | DRG: 866 | Disposition: A | Discharge status: Home / Self Care | Payer: Medicare Other | Attending: Family Medicine | Admitting: Family Medicine

## 2012-05-07 ENCOUNTER — Encounter (HOSPITAL_COMMUNITY): Payer: Self-pay | Admitting: *Deleted

## 2012-05-07 DIAGNOSIS — M899 Disorder of bone, unspecified: Secondary | ICD-10-CM | POA: Diagnosis present

## 2012-05-07 DIAGNOSIS — B029 Zoster without complications: Secondary | ICD-10-CM | POA: Diagnosis present

## 2012-05-07 DIAGNOSIS — B028 Zoster with other complications: Principal | ICD-10-CM | POA: Diagnosis present

## 2012-05-07 DIAGNOSIS — E876 Hypokalemia: Secondary | ICD-10-CM

## 2012-05-07 DIAGNOSIS — Z85038 Personal history of other malignant neoplasm of large intestine: Secondary | ICD-10-CM

## 2012-05-07 DIAGNOSIS — R079 Chest pain, unspecified: Secondary | ICD-10-CM

## 2012-05-07 DIAGNOSIS — R5383 Other fatigue: Secondary | ICD-10-CM | POA: Diagnosis present

## 2012-05-07 DIAGNOSIS — R946 Abnormal results of thyroid function studies: Secondary | ICD-10-CM | POA: Diagnosis present

## 2012-05-07 DIAGNOSIS — E871 Hypo-osmolality and hyponatremia: Secondary | ICD-10-CM

## 2012-05-07 DIAGNOSIS — E236 Other disorders of pituitary gland: Secondary | ICD-10-CM | POA: Diagnosis present

## 2012-05-07 DIAGNOSIS — M949 Disorder of cartilage, unspecified: Secondary | ICD-10-CM | POA: Diagnosis present

## 2012-05-07 DIAGNOSIS — R5381 Other malaise: Secondary | ICD-10-CM | POA: Diagnosis present

## 2012-05-07 DIAGNOSIS — IMO0002 Reserved for concepts with insufficient information to code with codable children: Secondary | ICD-10-CM | POA: Diagnosis present

## 2012-05-07 DIAGNOSIS — I10 Essential (primary) hypertension: Secondary | ICD-10-CM | POA: Diagnosis present

## 2012-05-07 HISTORY — DX: Essential (primary) hypertension: I10

## 2012-05-07 LAB — COMPREHENSIVE METABOLIC PANEL
ALT: 19 U/L (ref 0–35)
AST: 23 U/L (ref 0–37)
Albumin: 4.1 g/dL (ref 3.5–5.2)
Calcium: 10.4 mg/dL (ref 8.4–10.5)
GFR calc Af Amer: >90 mL/min (ref >90)
Glucose, Bld: 118 mg/dL — ABNORMAL HIGH (ref 70–99)
Sodium: 119 mEq/L — CL (ref 135–145)
Total Protein: 7 g/dL (ref 6.0–8.3)

## 2012-05-07 LAB — CBC WITH DIFFERENTIAL/PLATELET
Basophils Absolute: 0 10*3/uL (ref 0.0–0.1)
Basophils Absolute: 0 10*3/uL (ref 0.0–0.1)
Basophils Relative: 0 % (ref 0–1)
Eosinophils Absolute: 0.1 10*3/uL (ref 0.0–0.7)
Hemoglobin: 13.5 g/dL (ref 12.0–15.0)
Lymphocytes Relative: 19 % (ref 12–46)
MCH: 31.9 pg (ref 26.0–34.0)
MCHC: 38.1 g/dL — ABNORMAL HIGH (ref 30.0–36.0)
MCHC: 38.1 g/dL — ABNORMAL HIGH (ref 30.0–36.0)
Monocytes Absolute: 0.6 10*3/uL (ref 0.1–1.0)
Neutro Abs: 6.5 10*3/uL (ref 1.7–7.7)
Neutrophils Relative %: 72 % (ref 43–77)
Neutrophils Relative %: 73 % (ref 43–77)
Platelets: 220 10*3/uL (ref 150–400)
RDW: 11.8 % (ref 11.5–15.5)
RDW: 11.8 % (ref 11.5–15.5)
WBC: 8.9 10*3/uL (ref 4.0–10.5)

## 2012-05-07 LAB — URINE MICROSCOPIC-ADD ON

## 2012-05-07 LAB — URINALYSIS, ROUTINE W REFLEX MICROSCOPIC
Specific Gravity, Urine: 1.014 (ref 1.005–1.030)
Urobilinogen, UA: 0.2 mg/dL (ref 0.0–1.0)
pH: 5.5 (ref 5.0–8.0)

## 2012-05-07 MED ORDER — MORPHINE SULFATE 2 MG/ML IJ SOLN
1.0000 mg | INTRAMUSCULAR | Status: DC | PRN
  Administered 2012-05-08: 1 mg via INTRAVENOUS
  Filled 2012-05-07: qty 1

## 2012-05-07 MED ORDER — LATANOPROST 0.005 % OP SOLN
1.0000 [drp] | Freq: Every day | OPHTHALMIC | Status: DC
  Administered 2012-05-08 – 2012-05-10 (×4): 1 [drp] via OPHTHALMIC
  Filled 2012-05-07: qty 2.5

## 2012-05-07 MED ORDER — ACETAMINOPHEN 325 MG PO TABS: 650.0000 mg | ORAL_TABLET | Freq: Four times a day (QID) | ORAL | Status: DC | PRN

## 2012-05-07 MED ORDER — TRAMADOL HCL 50 MG PO TABS
50.0000 mg | ORAL_TABLET | Freq: Three times a day (TID) | ORAL | Status: DC
  Administered 2012-05-08 – 2012-05-09 (×7): 50 mg via ORAL
  Filled 2012-05-07 (×10): qty 1

## 2012-05-07 MED ORDER — METOPROLOL SUCCINATE ER 100 MG PO TB24
100.0000 mg | ORAL_TABLET | Freq: Every day | ORAL | Status: DC
Start: 2012-05-07 — End: 2012-05-11
  Administered 2012-05-08 – 2012-05-11 (×4): 100 mg via ORAL
  Filled 2012-05-07 (×5): qty 1

## 2012-05-07 MED ORDER — SODIUM CHLORIDE 0.9 % IJ SOLN
3.0000 mL | Freq: Two times a day (BID) | INTRAMUSCULAR | Status: DC
  Administered 2012-05-10: 3 mL via INTRAVENOUS

## 2012-05-07 MED ORDER — ONDANSETRON HCL 4 MG/2ML IJ SOLN: 4.0000 mg | Freq: Four times a day (QID) | INTRAMUSCULAR | Status: DC | PRN

## 2012-05-07 MED ORDER — METOPROLOL SUCCINATE ER 25 MG PO TB24
25.0000 mg | ORAL_TABLET | Freq: Every day | ORAL | Status: DC
  Administered 2012-05-08 – 2012-05-10 (×4): 25 mg via ORAL
  Filled 2012-05-07 (×6): qty 1

## 2012-05-07 MED ORDER — SODIUM CHLORIDE 0.9 % IV BOLUS (SEPSIS)
500.0000 mL | Freq: Once | INTRAVENOUS | Status: AC
  Administered 2012-05-07: 500 mL via INTRAVENOUS

## 2012-05-07 MED ORDER — ADULT MULTIVITAMIN W/MINERALS CH
1.0000 | ORAL_TABLET | Freq: Every day | ORAL | Status: DC
  Administered 2012-05-08 – 2012-05-11 (×4): 1 via ORAL
  Filled 2012-05-07 (×4): qty 1

## 2012-05-07 MED ORDER — SODIUM CHLORIDE 0.9 % IV SOLN
INTRAVENOUS | Status: DC
  Administered 2012-05-08 (×2): via INTRAVENOUS

## 2012-05-07 MED ORDER — ASPIRIN BUFFERED 325 MG PO TABS: 325.0000 mg | ORAL_TABLET | Freq: Every day | ORAL | Status: DC

## 2012-05-07 MED ORDER — ONDANSETRON HCL 4 MG PO TABS
4.0000 mg | ORAL_TABLET | Freq: Four times a day (QID) | ORAL | Status: DC | PRN
  Administered 2012-05-08: 4 mg via ORAL
  Filled 2012-05-07: qty 1

## 2012-05-07 MED ORDER — HYDROXYZINE HCL 10 MG PO TABS
5.0000 mg | ORAL_TABLET | Freq: Three times a day (TID) | ORAL | Status: DC | PRN
  Administered 2012-05-09: 10 mg via ORAL
  Administered 2012-05-10: 5 mg via ORAL
  Filled 2012-05-07 (×4): qty 1

## 2012-05-07 MED ORDER — PROMETHAZINE HCL 25 MG PO TABS: 12.5000 mg | ORAL_TABLET | Freq: Four times a day (QID) | ORAL | Status: DC | PRN

## 2012-05-07 MED ORDER — RISEDRONATE SODIUM 5 MG PO TABS: 35.0000 mg | ORAL_TABLET | ORAL | Status: DC

## 2012-05-07 MED ORDER — HYDROCHLOROTHIAZIDE 12.5 MG PO CAPS
12.5000 mg | ORAL_CAPSULE | Freq: Every day | ORAL | Status: DC
  Administered 2012-05-08: 12.5 mg via ORAL
  Filled 2012-05-07: qty 1

## 2012-05-07 MED ORDER — HYDROCODONE-ACETAMINOPHEN 5-325 MG PO TABS
1.0000 | ORAL_TABLET | ORAL | Status: DC | PRN
  Administered 2012-05-09: 2 via ORAL
  Administered 2012-05-09: 1 via ORAL
  Administered 2012-05-10 (×2): 2 via ORAL
  Filled 2012-05-07 (×4): qty 2

## 2012-05-07 MED ORDER — ASPIRIN 325 MG PO TABS
325.0000 mg | ORAL_TABLET | Freq: Every day | ORAL | Status: DC
  Administered 2012-05-08 – 2012-05-11 (×4): 325 mg via ORAL
  Filled 2012-05-07 (×4): qty 1

## 2012-05-07 MED ORDER — NITROGLYCERIN 0.4 MG SL SUBL: 0.4000 mg | SUBLINGUAL_TABLET | SUBLINGUAL | Status: DC | PRN

## 2012-05-07 MED ORDER — ACETAMINOPHEN 650 MG RE SUPP: 650.0000 mg | Freq: Four times a day (QID) | RECTAL | Status: DC | PRN

## 2012-05-07 MED ORDER — ENOXAPARIN SODIUM 30 MG/0.3ML ~~LOC~~ SOLN
30.0000 mg | SUBCUTANEOUS | Status: DC
  Administered 2012-05-08 – 2012-05-09 (×2): 30 mg via SUBCUTANEOUS
  Filled 2012-05-07 (×2): qty 0.3

## 2012-05-07 MED ORDER — POLYETHYLENE GLYCOL 3350 17 G PO PACK
17.0000 g | PACK | Freq: Every day | ORAL | Status: DC
  Administered 2012-05-08 – 2012-05-11 (×4): 17 g via ORAL
  Filled 2012-05-07 (×4): qty 1

## 2012-05-07 NOTE — ED Notes (Signed)
Bed:WA03<BR> Expected date:<BR> Expected time:<BR> Means of arrival:<BR> Comments:<BR>

## 2012-05-07 NOTE — ED Notes (Signed)
Pt states she does not know why she was brought in the ED.  Pt is denying nausea, vomiting or diarrhea at this time.  Pt is A&O x 2.  Pt presents with open sores on her back and R chest.  Pt reports soreness.  Does not recall when she started to have these sores.

## 2012-05-07 NOTE — ED Notes (Signed)
Per EMS, pt from [facility], reports weakness and n/v/d x 3 days.  Pt is A&Ox 4.   Pt denies any pain at this time

## 2012-05-07 NOTE — ED Provider Notes (Signed)
History     CSN: [id number]  Arrival date & time 05/07/12  1116   First MD Initiated Contact with Patient 05/07/12 1241      Chief Complaint  Patient presents with  . Weakness  . Rash    (Consider location/radiation/quality/duration/timing/severity/associated sxs/prior treatment) Patient is a 79 y.o. female presenting with weakness and rash. The history is provided by the patient (the pt complains of weakness). No language interpreter was used.  Weakness This is a new problem. The current episode started 2 days ago. The problem occurs constantly. The problem has not changed since onset.Pertinent negatives include no chest pain, no abdominal pain and no headaches. Nothing aggravates the symptoms. Nothing relieves the symptoms. She has tried nothing for the symptoms.  Rash Associated symptoms: no abdominal pain, no diarrhea, no fatigue and no headaches     Past Medical History  Diagnosis Date  . Paget's bone disease   . Hypertension     Past Surgical History  Procedure Laterality Date  . Hip arthroplasty      No family history on file.  History  Substance Use Topics  . Smoking status: Never Smoker   . Smokeless tobacco: Not on file  . Alcohol Use: No    OB History   Grav Para Term Preterm Abortions TAB SAB Ect Mult Living                  Review of Systems  Constitutional: Negative for appetite change and fatigue.  HENT: Negative for congestion, sinus pressure and ear discharge.   Eyes: Negative for discharge.  Respiratory: Negative for cough.   Cardiovascular: Negative for chest pain.  Gastrointestinal: Negative for abdominal pain and diarrhea.  Genitourinary: Negative for frequency and hematuria.  Musculoskeletal: Negative for back pain.  Skin: Positive for rash.  Neurological: Positive for weakness. Negative for seizures and headaches.  Psychiatric/Behavioral: Negative for hallucinations.    Allergies  Percocet  Home Medications   Current  Outpatient Rx  Name  Route  Sig  Dispense  Refill  . aspirin 325 MG buffered tablet   Oral   Take 325 mg by mouth daily.         . hydrochlorothiazide (MICROZIDE) 12.5 MG capsule   Oral   Take 12.5 mg by mouth daily.         . hydrOXYzine (ATARAX/VISTARIL) 10 MG tablet   Oral   Take 5-10 mg by mouth 3 (three) times daily as needed for anxiety.         Marland Kitchen latanoprost (XALATAN) 0.005 % ophthalmic solution   Both Eyes   Place 1 drop into both eyes at bedtime.          . metoprolol succinate (TOPROL-XL) 25 MG 24 hr tablet   Oral   Take 25 mg by mouth at bedtime.         . metoprolol succinate (TOPROL-XL) 50 MG 24 hr tablet   Oral   Take 100 mg by mouth daily. Take with or immediately following a meal.         . Multiple Vitamin (MULTIVITAMIN WITH MINERALS) TABS   Oral   Take 1 tablet by mouth daily.         . polyethylene glycol (MIRALAX / GLYCOLAX) packet   Oral   Take 17 g by mouth daily.         . promethazine (PHENERGAN) 12.5 MG tablet   Oral   Take 12.5 mg by mouth every 6 (six) hours as  needed for nausea.         . risedronate (ACTONEL) 35 MG tablet   Oral   Take 35 mg by mouth every 7 (seven) days. with water on empty stomach, nothing by mouth or lie down for next 30 minutes.         . traMADol (ULTRAM) 50 MG tablet   Oral   Take 50 mg by mouth 3 (three) times daily. pain           BP 168/85  Pulse 62  Temp(Src) 98 F (36.7 C) (Oral)  Resp 16  SpO2 98%  Physical Exam  Constitutional: She is oriented to person, place, and time. She appears well-developed.  HENT:  Head: Normocephalic.  Eyes: Conjunctivae and EOM are normal. No scleral icterus.  Neck: Neck supple. No thyromegaly present.  Cardiovascular: Normal rate and regular rhythm.  Exam reveals no gallop and no friction rub.   No murmur heard. Pulmonary/Chest: No stridor. She has no wheezes. She has no rales. She exhibits no tenderness.  Abdominal: She exhibits no distension.  There is no tenderness. There is no rebound.  Musculoskeletal: Normal range of motion. She exhibits no edema.  Lymphadenopathy:    She has no cervical adenopathy.  Neurological: She is oriented to person, place, and time. Coordination normal.  Skin: No rash noted. No erythema.  Psychiatric: She has a normal mood and affect. Her behavior is normal.    ED Course  Procedures (including critical care time)  Labs Reviewed  CBC WITH DIFFERENTIAL - Abnormal; Notable for the following:    MCHC 38.1 (*)    All other components within normal limits  COMPREHENSIVE METABOLIC PANEL - Abnormal; Notable for the following:    Sodium 119 (*)    Potassium 3.4 (*)    Chloride 81 (*)    Glucose, Bld 118 (*)    GFR calc non Af Amer 81 (*)    All other components within normal limits  TROPONIN I  URINALYSIS, ROUTINE W REFLEX MICROSCOPIC   Dg Chest Port 1 View  05/07/2012  *RADIOLOGY REPORT*  Clinical Data: Chest pain, weakness and hypertension.  PORTABLE CHEST - 1 VIEW  Comparison: Chest x-ray from an abdominal series dated 03/29/2012.  Findings: Stable chronic lung disease.  No acute infiltrate, pulmonary edema or pleural fluid is identified.  Heart size is within normal limits.  Stable tortuosity of the thoracic aorta.  IMPRESSION: Stable chronic lung disease.  No acute findings.   Original Report Authenticated By: Irish Lack, M.D.      1. Hyponatremia      Date: 05/07/2012  Rate: 63  Rhythm: normal sinus rhythm  QRS Axis: normal  Intervals: normal  ST/T Wave abnormalities: normal  Conduction Disutrbances:none  Narrative Interpretation:   Old EKG Reviewed: none available    MDM          Benny Lennert, MD 05/07/12 1432

## 2012-05-07 NOTE — Progress Notes (Signed)
CSW received referral for patient being admitted from a facility. Patient is a independent resident at [facility] [phone]). CSW confirmed with facility and patient. Patient hopeful to return independent. Please consult CSW if further needs arise or higher level of care at discharge is needed. .No further Clinical Social Work needs, signing off.   Catha Gosselin, [place]  [phone] .05/07/2012 1716pm

## 2012-05-07 NOTE — ED Notes (Signed)
Per Pt's caregiver, Pt has eaten very little in "about a week."  Sts Pt having increased weakness.  Sts Pt sts vomited this AM x 1.  Sts Pt Dx with shingles x 1 week ago.  Sts decline started when Pt's husband was hospitalized x 1 week ago.

## 2012-05-07 NOTE — H&P (Addendum)
[facility] History and Physical  TAWANDA SCHALL RUE:[id number] DOB: 1922-10-07 DOA: 05/07/2012  Referring physician: ER physician PCP: Miguel Aschoff, MD   Chief Complaint: chest pain  HPI: 79 year old female who is from an independent living facility, medical history of colon cancer (in 2009 but now in remission), status post chemotherapy (under Dr. Lonell Face care), Paget's bone disease and hypertension who presented to [facility] ED 05/07/2012 with chest pain. Per ED physician, patient presented with worsening weakness and rash for past few days. However, patient reports she came to [facility] to visit her husband because he was apparently in ED. She started to experience chest pain in retrosternal area for which reason she is being admitted. Patient has repeatedly said she has a chest pain. This is very questionable history and patient could not tell the date or year so i think she is not a reliable historian at this time. In ED, patient was found to have hyponatremia of 119 and potassium of 3.4. Her 12 lead EKG showed sinus rhythm.  Assessment and Plan:  Principal Problem: *Chest pain - less likely Acute coronary syndrome. We will however cycle cardiac enzymes. The 1st set is negative. Obtain 2 D ECHO - continue metoprolol and aspirin - as mentioned above 12 lead EKG is normal sinus rhythm  Active problems: Hyponatremia - due to dehydration - continue IV fluids - Follow up BMET in am  Hypokalemia - repleted in ED - follow up potassium in am   Code Status: Full Family Communication: No family available at the bedside Disposition Plan: Admit for further evaluation  Manson Passey, MD  [facility] Pager [phone]  If 7PM-7AM, please contact night-coverage [email or web address] Password [email or web address] 05/07/2012, 2:55 PM   Review of Systems:  Constitutional: Negative for fever, chills and malaise/fatigue. Negative for diaphoresis.  HENT: Negative for hearing loss, ear pain, nosebleeds, congestion, sore throat,  neck pain, tinnitus and ear discharge.   Eyes: Negative for blurred vision, double vision, photophobia, pain, discharge and redness.  Respiratory: Negative for cough, hemoptysis, sputum production, shortness of breath, wheezing and stridor.   Cardiovascular: positive  for chest pain, no palpitations, orthopnea, claudication and leg swelling.  Gastrointestinal: Negative for nausea, vomiting and abdominal pain. Negative for heartburn, constipation, blood in stool and melena.  Genitourinary: Negative for dysuria, urgency, frequency, hematuria and flank pain.  Musculoskeletal: Negative for myalgias, back pain, joint pain and falls.  Skin: Negative for itching and rash.  Neurological: Negative for dizziness and weakness. Negative for tingling, tremors, sensory change, speech change, focal weakness, loss of consciousness and headaches.  Endo/Heme/Allergies: Negative for environmental allergies and polydipsia. Does not bruise/bleed easily.  Psychiatric/Behavioral: Negative for suicidal ideas. The patient is not nervous/anxious.      Past Medical History  Diagnosis Date  . Paget's bone disease   . Hypertension    Past Surgical History  Procedure Laterality Date  . Hip arthroplasty     Social History:  reports that she has never smoked. She does not have any smokeless tobacco history on file. She reports that she does not drink alcohol or use illicit drugs.  Allergies  Allergen Reactions  . Percocet (Oxycodone-Acetaminophen)     unknown    Family History: Family medical history significant for HTN, HLD   Prior to Admission medications   Medication Sig Start Date End Date Taking? Authorizing Provider  aspirin 325 MG buffered tablet Take 325 mg by mouth daily.   Yes Historical Provider, MD  hydrochlorothiazide (MICROZIDE) 12.5 MG capsule Take 12.5 mg  by mouth daily.   Yes Historical Provider, MD  hydrOXYzine (ATARAX/VISTARIL) 10 MG tablet Take 5-10 mg by mouth 3 (three) times daily as  needed for anxiety.   Yes Historical Provider, MD  latanoprost (XALATAN) 0.005 % ophthalmic solution Place 1 drop into both eyes at bedtime.    Yes Historical Provider, MD  metoprolol succinate (TOPROL-XL) 25 MG 24 hr tablet Take 25 mg by mouth at bedtime.   Yes Historical Provider, MD  metoprolol succinate (TOPROL-XL) 50 MG 24 hr tablet Take 100 mg by mouth daily. Take with or immediately following a meal.   Yes Historical Provider, MD  Multiple Vitamin (MULTIVITAMIN WITH MINERALS) TABS Take 1 tablet by mouth daily.   Yes Historical Provider, MD  polyethylene glycol (MIRALAX / GLYCOLAX) packet Take 17 g by mouth daily.   Yes Historical Provider, MD  promethazine (PHENERGAN) 12.5 MG tablet Take 12.5 mg by mouth every 6 (six) hours as needed for nausea.   Yes Historical Provider, MD  risedronate (ACTONEL) 35 MG tablet Take 35 mg by mouth every 7 (seven) days. with water on empty stomach, nothing by mouth or lie down for next 30 minutes.   Yes Historical Provider, MD  traMADol (ULTRAM) 50 MG tablet Take 50 mg by mouth 3 (three) times daily. pain   Yes Historical Provider, MD   Physical Exam: Filed Vitals:   05/07/12 1122 05/07/12 1124  BP: 173/85 168/85  Pulse: 62   Temp: 98 F (36.7 C)   TempSrc: Oral   Resp: 16   SpO2: 98%     Physical Exam  Constitutional: Appears well-developed and well-nourished. No distress.  HENT: Normocephalic. External right and left ear normal. Oropharynx is clear and moist.  Eyes: Conjunctivae and EOM are normal. PERRLA, no scleral icterus.  Neck: Normal ROM. Neck supple. No JVD. No tracheal deviation. No thyromegaly.  CVS: RRR, S1/S2 +, no murmurs, no gallops, no carotid bruit.  Pulmonary: Effort and breath sounds normal, no stridor, rhonchi, wheezes, rales.  Abdominal: Soft. BS +,  no distension, tenderness, rebound or guarding.  Musculoskeletal: Normal range of motion. No edema and no tenderness.  Lymphadenopathy: No lymphadenopathy noted, cervical,  inguinal. Neuro: Alert. Normal reflexes, muscle tone coordination. No cranial nerve deficit. Skin: Skin is warm and dry. No rash noted. Not diaphoretic. No erythema. No pallor.  Psychiatric: Normal mood and affect.  Labs on Admission:  Basic Metabolic Panel:  Recent Labs Lab 05/07/12 1250  NA 119*  K 3.4*  CL 81*  CO2 25  GLUCOSE 118*  BUN 19  CREATININE 0.57  CALCIUM 10.4   Liver Function Tests:  Recent Labs Lab 05/07/12 1250  AST 23  ALT 19  ALKPHOS 116  BILITOT 0.7  PROT 7.0  ALBUMIN 4.1   No results found for this basename: LIPASE, AMYLASE,  in the last 168 hours No results found for this basename: AMMONIA,  in the last 168 hours CBC:  Recent Labs Lab 05/07/12 1250  WBC 8.0  NEUTROABS 5.8  HGB 14.3  HCT 37.5  MCV 83.7  PLT 220   Cardiac Enzymes:  Recent Labs Lab 05/07/12 1250  TROPONINI <0.30   BNP: No components found with this basename: POCBNP,  CBG: No results found for this basename: GLUCAP,  in the last 168 hours  Radiological Exams on Admission: Dg Chest Port 1 View  05/07/2012  *RADIOLOGY REPORT*  Clinical Data: Chest pain, weakness and hypertension.  PORTABLE CHEST - 1 VIEW  Comparison: Chest x-ray from an abdominal  series dated 03/29/2012.  Findings: Stable chronic lung disease.  No acute infiltrate, pulmonary edema or pleural fluid is identified.  Heart size is within normal limits.  Stable tortuosity of the thoracic aorta.  IMPRESSION: Stable chronic lung disease.  No acute findings.   Original Report Authenticated By: Irish Lack, M.D.     Time spent: 75 minutes

## 2012-05-08 ENCOUNTER — Encounter (HOSPITAL_COMMUNITY): Payer: Self-pay

## 2012-05-08 DIAGNOSIS — I10 Essential (primary) hypertension: Secondary | ICD-10-CM

## 2012-05-08 DIAGNOSIS — B029 Zoster without complications: Secondary | ICD-10-CM

## 2012-05-08 DIAGNOSIS — I359 Nonrheumatic aortic valve disorder, unspecified: Secondary | ICD-10-CM

## 2012-05-08 LAB — COMPREHENSIVE METABOLIC PANEL
ALT: 16 U/L (ref 0–35)
Albumin: 3.9 g/dL (ref 3.5–5.2)
Alkaline Phosphatase: 100 U/L (ref 39–117)
BUN: 13 mg/dL (ref 6–23)
CO2: 26 mEq/L (ref 19–32)
Calcium: 9.8 mg/dL (ref 8.4–10.5)
Calcium: 9.9 mg/dL (ref 8.4–10.5)
Chloride: 84 mEq/L — ABNORMAL LOW (ref 96–112)
Creatinine, Ser: 0.49 mg/dL — ABNORMAL LOW (ref 0.50–1.10)
GFR calc Af Amer: >90 mL/min (ref >90)
GFR calc non Af Amer: 83 mL/min — ABNORMAL LOW (ref >90)
Glucose, Bld: 101 mg/dL — ABNORMAL HIGH (ref 70–99)
Sodium: 126 mEq/L — ABNORMAL LOW (ref 135–145)
Total Bilirubin: 0.9 mg/dL (ref 0.3–1.2)

## 2012-05-08 LAB — CBC WITH DIFFERENTIAL/PLATELET
Lymphocytes Relative: 17 % (ref 12–46)
Lymphs Abs: 1.6 10*3/uL (ref 0.7–4.0)
Neutrophils Relative %: 74 % (ref 43–77)
Platelets: 207 10*3/uL (ref 150–400)
RBC: 4.27 MIL/uL (ref 3.87–5.11)
WBC: 9 10*3/uL (ref 4.0–10.5)

## 2012-05-08 LAB — MRSA PCR SCREENING: MRSA by PCR: NEGATIVE

## 2012-05-08 LAB — CBC
Hemoglobin: 12.7 g/dL (ref 12.0–15.0)
MCHC: 37.4 g/dL — ABNORMAL HIGH (ref 30.0–36.0)
Platelets: 205 10*3/uL (ref 150–400)
RDW: 11.9 % (ref 11.5–15.5)

## 2012-05-08 LAB — TSH: TSH: 0.169 u[IU]/mL — ABNORMAL LOW (ref 0.350–4.500)

## 2012-05-08 LAB — PHOSPHORUS: Phosphorus: 2 mg/dL — ABNORMAL LOW (ref 2.3–4.6)

## 2012-05-08 LAB — PROTIME-INR
INR: 1.05 (ref 0.00–1.49)
Prothrombin Time: 13.6 seconds (ref 11.6–15.2)

## 2012-05-08 LAB — GLUCOSE, CAPILLARY: Glucose-Capillary: 101 mg/dL — ABNORMAL HIGH (ref 70–99)

## 2012-05-08 LAB — MAGNESIUM: Magnesium: 1.4 mg/dL — ABNORMAL LOW (ref 1.5–2.5)

## 2012-05-08 MED ORDER — LORAZEPAM 2 MG/ML IJ SOLN
0.5000 mg | Freq: Once | INTRAMUSCULAR | Status: AC
  Administered 2012-05-08: 0.5 mg via INTRAMUSCULAR
  Filled 2012-05-08: qty 1

## 2012-05-08 MED ORDER — VALACYCLOVIR HCL 500 MG PO TABS
1000.0000 mg | ORAL_TABLET | Freq: Three times a day (TID) | ORAL | Status: DC
  Administered 2012-05-08 – 2012-05-11 (×10): 1000 mg via ORAL
  Filled 2012-05-08 (×12): qty 2

## 2012-05-08 MED ORDER — ENSURE COMPLETE PO LIQD
237.0000 mL | ORAL | Status: DC
  Administered 2012-05-09 – 2012-05-11 (×3): 237 mL via ORAL

## 2012-05-08 MED ORDER — AMLODIPINE BESYLATE 10 MG PO TABS
10.0000 mg | ORAL_TABLET | Freq: Every day | ORAL | Status: DC
  Administered 2012-05-08 – 2012-05-11 (×4): 10 mg via ORAL
  Filled 2012-05-08 (×4): qty 1

## 2012-05-08 MED ORDER — HYDRALAZINE HCL 25 MG PO TABS
25.0000 mg | ORAL_TABLET | Freq: Four times a day (QID) | ORAL | Status: DC | PRN
  Filled 2012-05-08: qty 1

## 2012-05-08 NOTE — Evaluation (Signed)
Physical Therapy One Time Evaluation Patient Details Name: [name] MRN: [id number] DOB: August 26, 1922 Today's Date: 05/08/2012 Time: 4401-0272 PT Time Calculation (min): 14 min  PT Assessment / Plan / Recommendation Clinical Impression  Pt is an 79 year old female admitted for chest pain less likely Acute coronary syndrome.  Pt does not know why she is in hospital but does remember coming here from [facility].  Pt appears at her baseline and states she feels fine and ambulation/strength are her normal.  Pt wishes to return to ILF.  No further acute PT needs identifed at this time.    PT Assessment  Patent does not need any further PT services    Follow Up Recommendations  No PT follow up    Does the patient have the potential to tolerate intense rehabilitation      Barriers to Discharge        Equipment Recommendations  None recommended by PT    Recommendations for Other Services     Frequency      Precautions / Restrictions Precautions Precautions: Fall   Pertinent Vitals/Pain Pt denies pain      Mobility  Bed Mobility Details for Bed Mobility Assistance: sitting EOB on arrival Transfers Transfers: Sit to Stand;Stand to Sit Sit to Stand: 4: Min guard;With upper extremity assist;From toilet;From bed Stand to Sit: 4: Min guard;With upper extremity assist;To toilet;To bed Details for Transfer Assistance: pt reports using cane at home, verbal cues for hand placement Ambulation/Gait Ambulation/Gait Assistance: 4: Min guard Ambulation Distance (Feet): 25 Feet Assistive device: 1 person hand held assist Ambulation/Gait Assistance Details: airborne room so only ambulated to/from bathroom, pt did well with 1 HHA (uses cane), pt states her ambulation and strength is at baseline Gait Pattern: Step-through pattern Gait velocity: decreased General Gait Details: gait appears as if pt has LLD but no unsteadiness or LOB observed    Exercises     PT Diagnosis:    PT Problem List:    PT Treatment Interventions:     PT Goals    Visit Information  Last PT Received On: 05/08/12 Assistance Needed: +1    Subjective Data  Subjective: I feel fine.  I don't know why they brought me here. Patient Stated Goal: return home with spouse   Prior Functioning  Home Living Lives With: Spouse Type of Home: Independent living facility Home Adaptive Equipment: Raised toilet seat with rails;Straight cane Prior Function Level of Independence: Independent with assistive device(s) Communication Communication: No difficulties    Cognition  Cognition Arousal/Alertness: Awake/alert Behavior During Therapy: WFL for tasks assessed/performed Overall Cognitive Status: Within Functional Limits for tasks assessed    Extremity/Trunk Assessment Right Upper Extremity Assessment RUE ROM/Strength/Tone: [facility] for tasks assessed Left Upper Extremity Assessment LUE ROM/Strength/Tone: WFL for tasks assessed Right Lower Extremity Assessment RLE ROM/Strength/Tone: [facility] for tasks assessed Left Lower Extremity Assessment LLE ROM/Strength/Tone: WFL for tasks assessed   Balance Balance Balance Assessed: Yes Static Standing Balance Static Standing - Balance Support: During functional activity Static Standing - Level of Assistance: 5: Stand by assistance Static Standing - Comment/# of Minutes: pt able to perform hygiene and wash hands with supervision  End of Session PT - End of Session Activity Tolerance: Patient tolerated treatment well Patient left: in bed;with bed alarm set Nurse Communication: Mobility status  GP     Letizia Hook,KATHrine E 05/08/2012, 10:24 AM Zenovia Jarred, PT, DPT 05/08/2012 Pager: [phone]

## 2012-05-08 NOTE — Progress Notes (Signed)
Patient refusing to be stuck for IV insertion, will try again later once agitation has decreased

## 2012-05-08 NOTE — Progress Notes (Signed)
[facility] PROGRESS NOTE  [name]:[id number] DOB: 1922/06/05 DOA: 05/07/2012 PCP: Miguel Aschoff, MD  Assessment/Plan: 1. Herpes zoster- will start her on valtrex 1000 mg po tid 2. Chest pain- cardiac enzymes x 3 are negative. Chest pain may be due to shingles. Echo pending 3. Hypertension- continue with metoprolol, , will start prn hydralazine for BP > 160/100. 4. Hyponatremia- will d.c HCTZ, and start amlodipine 10 mg po daily. Most likely siadh , will obtain serum osmolality and urine sodium.  Code Status: Full   Family Communication: Spoke to the patient Disposition Plan: TBD   Consultants:  None  Procedures:  None  Antibiotics:  None  HPI/Subjective: Patient admitted with chest pain, has rash involving the right side of chest, which is painful. Says the rash started   week ago.  Objective: Filed Vitals:   05/07/12 2253 05/08/12 0500 05/08/12 0615 05/08/12 1439  BP: 175/115  136/77 180/97  Pulse: 92  60 68  Temp: 98.2 F (36.8 C)  97.6 F (36.4 C) 97.4 F (36.3 C)  TempSrc: Oral  Oral Oral  Resp: 22  20 20   Height: 5\' 4"  (1.626 m)     Weight: 45.7 kg (100 lb 12 oz) 45.3 kg (99 lb 13.9 oz)    SpO2: 97%  99% 100%    Intake/Output Summary (Last 24 hours) at 05/08/12 1618 Last data filed at 05/08/12 1411  Gross per 24 hour  Intake 1413.75 ml  Output    950 ml  Net 463.75 ml   Filed Weights   05/07/12 2253 05/08/12 0500  Weight: 45.7 kg (100 lb 12 oz) 45.3 kg (99 lb 13.9 oz)    Exam:   General: Appear in no acute distress  Cardiovascular: s1s2 RRR  Respiratory: Clear bilaterally  Abdomen: Soft, nontender  Musculoskeletal: No edema  Skin: erythematous rash noticed on the right chest wall, with scabs over few areas. Positive tender to touch  Data Reviewed: Basic Metabolic Panel:  Recent Labs Lab 05/07/12 1250 05/07/12 2335 05/08/12 0547  NA 119* 122* 126*  K 3.4* 2.9* 3.6  CL 81* 84* 90*  CO2 25 25 26   GLUCOSE 118* 124*  101*  BUN 19 13 12   CREATININE 0.57 0.49* 0.53  CALCIUM 10.4 9.9 9.8  MG  --  1.4*  --   PHOS  --  2.0*  --    Liver Function Tests:  Recent Labs Lab 05/07/12 1250 05/07/12 2335 05/08/12 0547  AST 23 22 24   ALT 19 18 16   ALKPHOS 116 110 100  BILITOT 0.7 0.9 0.6  PROT 7.0 6.7 6.1  ALBUMIN 4.1 3.9 3.6   No results found for this basename: LIPASE, AMYLASE,  in the last 168 hours No results found for this basename: AMMONIA,  in the last 168 hours CBC:  Recent Labs Lab 05/07/12 1250 05/07/12 2335 05/07/12 2348 05/08/12 0547  WBC 8.0 8.9 9.0 8.2  NEUTROABS 5.8 6.5 6.6  --   HGB 14.3 13.5 13.7 12.7  HCT 37.5 35.4* 35.7* 34.0*  MCV 83.7 83.5 83.6 84.0  PLT 220 211 207 205   Cardiac Enzymes:  Recent Labs Lab 05/07/12 1250 05/07/12 2335 05/08/12 0547  TROPONINI <0.30 <0.30 <0.30   BNP (last 3 results)  Recent Labs  05/07/12 2335  PROBNP 752.3*   CBG:  Recent Labs Lab 05/08/12 0748  GLUCAP 101*    Recent Results (from the past 240 hour(s))  MRSA PCR SCREENING     Status: None   Collection  Time    05/08/12  6:40 AM      Result Value Range Status   MRSA by PCR NEGATIVE  NEGATIVE Final   Comment:            The GeneXpert MRSA Assay (FDA     approved for NASAL specimens     only), is one component of a     comprehensive MRSA colonization     surveillance program. It is not     intended to diagnose MRSA     infection nor to guide or     monitor treatment for     MRSA infections.     Studies: Dg Chest Port 1 View  05/07/2012  *RADIOLOGY REPORT*  Clinical Data: Chest pain, weakness and hypertension.  PORTABLE CHEST - 1 VIEW  Comparison: Chest x-ray from an abdominal series dated 03/29/2012.  Findings: Stable chronic lung disease.  No acute infiltrate, pulmonary edema or pleural fluid is identified.  Heart size is within normal limits.  Stable tortuosity of the thoracic aorta.  IMPRESSION: Stable chronic lung disease.  No acute findings.   Original  Report Authenticated By: Irish Lack, M.D.     Scheduled Meds: . aspirin  325 mg Oral Daily  . enoxaparin (LOVENOX) injection  30 mg Subcutaneous Q24H  . [START ON 05/09/2012] feeding supplement  237 mL Oral Q24H  . hydrochlorothiazide  12.5 mg Oral Daily  . latanoprost  1 drop Both Eyes QHS  . metoprolol succinate  100 mg Oral Daily  . metoprolol succinate  25 mg Oral QHS  . multivitamin with minerals  1 tablet Oral Daily  . polyethylene glycol  17 g Oral Daily  . sodium chloride  3 mL Intravenous Q12H  . traMADol  50 mg Oral TID  . valACYclovir  1,000 mg Oral TID   Continuous Infusions: . sodium chloride 75 mL/hr at 05/08/12 1559    Principal Problem:   Chest pain Active Problems:   Hyponatremia   Hypokalemia    Time spent: 35 min    [facility]  [facility] Pager [phone]. If 7PM-7AM, please contact night-coverage at [email or web address], password [facility] 05/08/2012, 4:18 PM  LOS: 1 day

## 2012-05-08 NOTE — Progress Notes (Signed)
   CARE MANAGEMENT NOTE 05/08/2012  Patient:  [name], [name]   Account Number:  [id number]  Date Initiated:  05/08/2012  Documentation initiated by:  Jiles Crocker  Subjective/Objective Assessment:   ADMITTED WITH CHEST PAIN, NAUSEA/VOMITING/ WEAKNESS; RECENT SHINGLES     Action/Plan:   PATIENT RESIDES IN AN INDEPENDENT LIVING FACILITY;  PCP: Miguel Aschoff, MD  POSSIBLY NEED HHC AT DISCHARGE; CM FOLLOWING FOR DCP   Anticipated DC Date:  05/11/2012   Anticipated DC Plan:  HOME W HOME HEALTH SERVICES  In-house referral  Clinical Social Worker      DC Planning Services  CM consult         Status of service:  In process, will continue to follow Medicare Important Message given?  NA - LOS <3 / Initial given by admissions (If response is "NO", the following Medicare IM given date fields will be blank) Per UR Regulation:  Reviewed for med. necessity/level of care/duration of stay Comments:  05/08/2012- B Mckaylin Bastien RN,BSN,MHA

## 2012-05-08 NOTE — Progress Notes (Signed)
Pt upset states that she has not been here until now. States that she is not sick and she needs to leave.  Called the pt daughter and let her speak with her.  Pt starts to yell, and say that she hasn't seen the Dr all day.  Explained to the pt that I have been here with her all day, and the Dr has been in.  Continues to say that she has not been here. Reassurance given.

## 2012-05-08 NOTE — Progress Notes (Signed)
PHARMACIST - PHYSICIAN COMMUNICATION  CONCERNING: P&T Medication Policy Regarding Oral Bisphosphonates  RECOMMENDATION: Your order for alendronate (Fosamax), ibandronate (Boniva), or risedronate (Actonel) has been discontinued at this time.  If the patient's post-hospital medical condition warrants safe use of this class of drugs, please resume the pre-hospital regimen upon discharge.  DESCRIPTION:  Alendronate (Fosamax), ibandronate (Boniva), and risedronate (Actonel) can cause severe esophageal erosions in patients who are unable to remain upright at least 30 minutes after taking this medication.   Since brief interruptions in therapy are thought to have minimal impact on bone mineral density, the Pharmacy & Therapeutics Committee has established that bisphosphonate orders should be routinely discontinued during hospitalization.   To override this safety policy and permit administration of Boniva, Fosamax, or Actonel in the hospital, prescribers must write "DO NOT HOLD" in the comments section when placing the order for this class of medications.  [name] 05/08/2012 2:46 AM

## 2012-05-08 NOTE — Progress Notes (Signed)
Patient continues to become upset about her husband leaving without saying goodbye, states that she needs to talk to him or a family member, patient pulled out IV trying "to walk back to the hotel that she is staying at", patient very confused, Nurse tech sitting with patient, bed alarm on, will continue to monitor

## 2012-05-08 NOTE — Progress Notes (Signed)
Spoke with ID nurse.  Pt has shingles with scabs noted.  Pt now on contract and no longer in need of airborne precautions.

## 2012-05-08 NOTE — Progress Notes (Signed)
INITIAL NUTRITION ASSESSMENT  DOCUMENTATION CODES Per approved criteria  -Underweight   INTERVENTION: Provide [employer] Breakfast once daily Provide Ensure Complete once daily Encourage PO intake Continue Multivitamin with minerals daily   NUTRITION DIAGNOSIS: Inadequate oral intake related to age (?) as evidenced by BMI of 17.13 and 7% unintentional wt loss in less than 1 year.   Goal: Pt to meet >/= 90% of their estimated nutrition needs  Monitor:  PO intake Wt Labs  Reason for Assessment: MST  79 y.o. female  Admitting Dx: Chest pain  ASSESSMENT: 79 year old female who is from an independent living facility, medical history of colon cancer (in 2009 but now in remission), status post chemotherapy (under Dr. Lonell Face care), Paget's bone disease and hypertension who presented to [facility] ED 05/07/2012 with chest pain.   Pt reports that her appetite is good and that she eats well. Pt states that she has been eating most of the food on her trays since admission but, it appeared as if pt only consumed 50% of lunch tray. Pt reports eating 3 good meals daily PTA. Per pt she weighed 145 lbs about 1 year ago and most recently weighed 118 lbs- pt seemed unaware of wt loss and does not know why she has been losing weight. Encouraged pt to continue eating 3 good meals daily, monitor weight, and drink carnation instant breakfast with milk after discharge to prevent further weight loss.   Height: Ht Readings from Last 1 Encounters:  05/07/12 5\' 4"  (1.626 m)    Weight: Wt Readings from Last 1 Encounters:  05/08/12 99 lb 13.9 oz (45.3 kg)    Ideal Body Weight: 120 lbs  % Ideal Body Weight: 83%  Wt Readings from Last 10 Encounters:  05/08/12 99 lb 13.9 oz (45.3 kg)  05/24/11 107 lb 12.8 oz (48.898 kg)    Usual Body Weight: 118 lbs  % Usual Body Weight: 84%  BMI:  Body mass index is 17.13 kg/(m^2).  Estimated Nutritional Needs: Kcal: 1170-1360 Protein: 54-63  grams Fluid: 1.4 L  Skin: Intact, flaky, dry, cracking  Nutrition Focused Physical Exam:  Subcutaneous Fat:  Orbital Region: wnl Upper Arm Region: mild wasting Thoracic and Lumbar Region: NA  Muscle:  Temple Region: wnl Clavicle Bone Region: mild wasting Clavicle and Acromion Bone Region: moderate wasting Scapular Bone Region: NA Dorsal Hand: mild wasting Patellar Region: wnl Anterior Thigh Region: wnl Posterior Calf Region: wnl  Edema: not present   Diet Order: Full Liquid  EDUCATION NEEDS: -No education needs identified at this time   Intake/Output Summary (Last 24 hours) at 05/08/12 1457 Last data filed at 05/08/12 1411  Gross per 24 hour  Intake 1413.75 ml  Output    950 ml  Net 463.75 ml    Last BM: PTA  Labs:   Recent Labs Lab 05/07/12 1250 05/07/12 2335 05/08/12 0547  NA 119* 122* 126*  K 3.4* 2.9* 3.6  CL 81* 84* 90*  CO2 25 25 26   BUN 19 13 12   CREATININE 0.57 0.49* 0.53  CALCIUM 10.4 9.9 9.8  MG  --  1.4*  --   PHOS  --  2.0*  --   GLUCOSE 118* 124* 101*    CBG (last 3)   Recent Labs  05/08/12 0748  GLUCAP 101*    Scheduled Meds: . aspirin  325 mg Oral Daily  . enoxaparin (LOVENOX) injection  30 mg Subcutaneous Q24H  . hydrochlorothiazide  12.5 mg Oral Daily  . latanoprost  1 drop Both Eyes QHS  . metoprolol succinate  100 mg Oral Daily  . metoprolol succinate  25 mg Oral QHS  . multivitamin with minerals  1 tablet Oral Daily  . polyethylene glycol  17 g Oral Daily  . sodium chloride  3 mL Intravenous Q12H  . traMADol  50 mg Oral TID  . valACYclovir  1,000 mg Oral TID    Continuous Infusions: . sodium chloride 75 mL/hr at 05/08/12 0056    Past Medical History  Diagnosis Date  . Paget's bone disease   . Hypertension     Past Surgical History  Procedure Laterality Date  . Hip arthroplasty      Ian Malkin RD, LDN Inpatient Clinical Dietitian Pager: [phone] After Hours Pager: [phone]

## 2012-05-08 NOTE — Progress Notes (Signed)
*  PRELIMINARY RESULTS* Echocardiogram 2D Echocardiogram has been performed.  Jeryl Columbia 05/08/2012, 12:16 PM

## 2012-05-09 LAB — BASIC METABOLIC PANEL
Calcium: 9.8 mg/dL (ref 8.4–10.5)
Creatinine, Ser: 0.57 mg/dL (ref 0.50–1.10)
GFR calc non Af Amer: 81 mL/min — ABNORMAL LOW (ref >90)
Glucose, Bld: 76 mg/dL (ref 70–99)
Sodium: 127 mEq/L — ABNORMAL LOW (ref 135–145)

## 2012-05-09 LAB — GLUCOSE, CAPILLARY: Glucose-Capillary: 76 mg/dL (ref 70–99)

## 2012-05-09 LAB — OSMOLALITY: Osmolality: 261 mOsm/kg — ABNORMAL LOW (ref 275–300)

## 2012-05-09 MED ORDER — ENOXAPARIN SODIUM 40 MG/0.4ML ~~LOC~~ SOLN
40.0000 mg | SUBCUTANEOUS | Status: DC
  Administered 2012-05-10 – 2012-05-11 (×2): 40 mg via SUBCUTANEOUS
  Filled 2012-05-09 (×2): qty 0.4

## 2012-05-09 NOTE — Progress Notes (Signed)
[facility] PROGRESS NOTE  [name] FAO:[id number] DOB: Mar 28, 1922 DOA: 05/07/2012 PCP: Miguel Aschoff, MD  Assessment/Plan: 1. Herpes zoster- will start her on valtrex 1000 mg po tid 2. Chest pain- cardiac enzymes x 3 are negative. Chest pain may be due to shingles. Echo pending 3. Hypertension- continue with metoprolol, , will start prn hydralazine for BP > 160/100. 4. Hyponatremia- will d.c HCTZ, and start amlodipine 10 mg po daily.  Improving.Most likely siadh , Will start her on fluid restriction 1200 ml/day.  Code Status: Full   Family Communication: Spoke to the patient Disposition Plan: TBD   Consultants:  None  Procedures:  None  Antibiotics:  None  HPI/Subjective: Patient admitted with chest pain, has rash involving the right side of chest, which is painful. Says the rash started   week ago. Chest  pain has resolved.  Objective: Filed Vitals:   05/08/12 1439 05/08/12 2036 05/09/12 0623 05/09/12 0700  BP: 180/97 147/72 124/74   Pulse: 68 70 67   Temp: 97.4 F (36.3 C) 97.5 F (36.4 C) 97.3 F (36.3 C)   TempSrc: Oral Axillary Oral   Resp: 20 19 20    Height:      Weight:    44.8 kg (98 lb 12.3 oz)  SpO2: 100% 100% 100%     Intake/Output Summary (Last 24 hours) at 05/09/12 1116 Last data filed at 05/09/12 0947  Gross per 24 hour  Intake   1005 ml  Output   1200 ml  Net   -195 ml   Filed Weights   05/07/12 2253 05/08/12 0500 05/09/12 0700  Weight: 45.7 kg (100 lb 12 oz) 45.3 kg (99 lb 13.9 oz) 44.8 kg (98 lb 12.3 oz)    Exam:   General: Appear in no acute distress  Cardiovascular: s1s2 RRR  Respiratory: Clear bilaterally  Abdomen: Soft, nontender  Musculoskeletal: No edema  Skin: erythematous rash noticed on the right chest wall, with scabs over few areas. Positive tender to touch  Data Reviewed: Basic Metabolic Panel:  Recent Labs Lab 05/07/12 1250 05/07/12 2335 05/08/12 0547 05/09/12 0559  NA 119* 122* 126* 127*  K  3.4* 2.9* 3.6 3.7  CL 81* 84* 90* 92*  CO2 25 25 26 24   GLUCOSE 118* 124* 101* 76  BUN 19 13 12 12   CREATININE 0.57 0.49* 0.53 0.57  CALCIUM 10.4 9.9 9.8 9.8  MG  --  1.4*  --   --   PHOS  --  2.0*  --   --    Liver Function Tests:  Recent Labs Lab 05/07/12 1250 05/07/12 2335 05/08/12 0547  AST 23 22 24   ALT 19 18 16   ALKPHOS 116 110 100  BILITOT 0.7 0.9 0.6  PROT 7.0 6.7 6.1  ALBUMIN 4.1 3.9 3.6   No results found for this basename: LIPASE, AMYLASE,  in the last 168 hours No results found for this basename: AMMONIA,  in the last 168 hours CBC:  Recent Labs Lab 05/07/12 1250 05/07/12 2335 05/07/12 2348 05/08/12 0547  WBC 8.0 8.9 9.0 8.2  NEUTROABS 5.8 6.5 6.6  --   HGB 14.3 13.5 13.7 12.7  HCT 37.5 35.4* 35.7* 34.0*  MCV 83.7 83.5 83.6 84.0  PLT 220 211 207 205   Cardiac Enzymes:  Recent Labs Lab 05/07/12 1250 05/07/12 2335 05/08/12 0547  TROPONINI <0.30 <0.30 <0.30   BNP (last 3 results)  Recent Labs  05/07/12 2335  PROBNP 752.3*   CBG:  Recent Labs Lab 05/08/12 0748  05/09/12 0748  GLUCAP 101* 76    Recent Results (from the past 240 hour(s))  MRSA PCR SCREENING     Status: None   Collection Time    05/08/12  6:40 AM      Result Value Range Status   MRSA by PCR NEGATIVE  NEGATIVE Final   Comment:            The GeneXpert MRSA Assay (FDA     approved for NASAL specimens     only), is one component of a     comprehensive MRSA colonization     surveillance program. It is not     intended to diagnose MRSA     infection nor to guide or     monitor treatment for     MRSA infections.     Studies: Dg Chest Port 1 View  05/07/2012  *RADIOLOGY REPORT*  Clinical Data: Chest pain, weakness and hypertension.  PORTABLE CHEST - 1 VIEW  Comparison: Chest x-ray from an abdominal series dated 03/29/2012.  Findings: Stable chronic lung disease.  No acute infiltrate, pulmonary edema or pleural fluid is identified.  Heart size is within normal limits.   Stable tortuosity of the thoracic aorta.  IMPRESSION: Stable chronic lung disease.  No acute findings.   Original Report Authenticated By: Irish Lack, M.D.     Scheduled Meds: . amLODipine  10 mg Oral Daily  . aspirin  325 mg Oral Daily  . enoxaparin (LOVENOX) injection  30 mg Subcutaneous Q24H  . feeding supplement  237 mL Oral Q24H  . latanoprost  1 drop Both Eyes QHS  . metoprolol succinate  100 mg Oral Daily  . metoprolol succinate  25 mg Oral QHS  . multivitamin with minerals  1 tablet Oral Daily  . polyethylene glycol  17 g Oral Daily  . sodium chloride  3 mL Intravenous Q12H  . traMADol  50 mg Oral TID  . valACYclovir  1,000 mg Oral TID   Continuous Infusions: . sodium chloride Stopped (05/08/12 2303)    Principal Problem:   Chest pain Active Problems:   Hyponatremia   Hypokalemia   HTN (hypertension)   Herpes zoster    Time spent: 35 min    [facility]  [facility] Pager [phone]. If 7PM-7AM, please contact night-coverage at [email or web address], password [facility] 05/09/2012, 11:16 AM  LOS: 2 days

## 2012-05-10 LAB — BASIC METABOLIC PANEL
Calcium: 9.2 mg/dL (ref 8.4–10.5)
Creatinine, Ser: 0.63 mg/dL (ref 0.50–1.10)
GFR calc non Af Amer: 78 mL/min — ABNORMAL LOW (ref >90)
Glucose, Bld: 87 mg/dL (ref 70–99)
Sodium: 123 mEq/L — ABNORMAL LOW (ref 135–145)

## 2012-05-10 MED ORDER — TRAMADOL HCL 50 MG PO TABS
25.0000 mg | ORAL_TABLET | Freq: Three times a day (TID) | ORAL | Status: DC
  Administered 2012-05-10: 25 mg via ORAL
  Administered 2012-05-10: 17:00:00 via ORAL
  Administered 2012-05-11: 25 mg via ORAL
  Filled 2012-05-10 (×3): qty 1

## 2012-05-10 MED ORDER — POTASSIUM CHLORIDE CRYS ER 20 MEQ PO TBCR
40.0000 meq | EXTENDED_RELEASE_TABLET | Freq: Four times a day (QID) | ORAL | Status: AC
  Administered 2012-05-10 – 2012-05-11 (×4): 40 meq via ORAL
  Filled 2012-05-10 (×4): qty 2

## 2012-05-10 MED ORDER — POTASSIUM CHLORIDE CRYS ER 20 MEQ PO TBCR: 40.0000 meq | EXTENDED_RELEASE_TABLET | Freq: Two times a day (BID) | ORAL | Status: DC

## 2012-05-10 NOTE — Progress Notes (Addendum)
[facility] PROGRESS NOTE  [name] ZOX:[id number] DOB: 1922-06-11 DOA: 05/07/2012 PCP: Miguel Aschoff, MD  Assessment/Plan: 1. Herpes zoster-  On valtrex 1000 mg po tid 2. Chest pain- cardiac enzymes x 3 are negative. Chest pain may be due to shingles. Echo pending 3. Hypertension- continue with metoprolol and add Norvascl, , will start prn hydralazine for BP > 160/100. 4. Hyponatremia- Sodium is low today,  will d.c HCTZ, and start amlodipine 10 mg po daily.  Improving.Most likely siadh , Will start her on fluid restriction 1200 ml/day. 5. Hypokalemia- Will replace potassium  Code Status: Full   Family Communication: Spoke to the patient Disposition Plan: TBD   Consultants:  None  Procedures:  None  Antibiotics:  None  HPI/Subjective: Patient admitted with chest pain, has rash involving the right side of chest, which is painful. Says the rash started   week ago. Chest  pain has resolved. Sodium is still low.  Objective: Filed Vitals:   05/09/12 2030 05/09/12 2136 05/09/12 2337 05/10/12 0524  BP: 129/73 104/70 135/76 121/62  Pulse: 67  80 54  Temp: 97.9 F (36.6 C)   97.7 F (36.5 C)  TempSrc: Oral   Oral  Resp: 18   19  Height:      Weight:    46 kg (101 lb 6.6 oz)  SpO2: 99%   98%    Intake/Output Summary (Last 24 hours) at 05/10/12 1050 Last data filed at 05/10/12 1000  Gross per 24 hour  Intake    940 ml  Output   1350 ml  Net   -410 ml   Filed Weights   05/08/12 0500 05/09/12 0700 05/10/12 0524  Weight: 45.3 kg (99 lb 13.9 oz) 44.8 kg (98 lb 12.3 oz) 46 kg (101 lb 6.6 oz)    Exam:   General: Appear in no acute distress  Cardiovascular: s1s2 RRR  Respiratory: Clear bilaterally  Abdomen: Soft, nontender  Musculoskeletal: No edema  Skin: erythematous rash noticed on the right chest wall, with scabs over few areas. Positive tender to touch  Data Reviewed: Basic Metabolic Panel:  Recent Labs Lab 05/07/12 1250 05/07/12 2335  05/08/12 0547 05/09/12 0559 05/10/12 0500  NA 119* 122* 126* 127* 123*  K 3.4* 2.9* 3.6 3.7 3.2*  CL 81* 84* 90* 92* 92*  CO2 25 25 26 24 24   GLUCOSE 118* 124* 101* 76 87  BUN 19 13 12 12 16   CREATININE 0.57 0.49* 0.53 0.57 0.63  CALCIUM 10.4 9.9 9.8 9.8 9.2  MG  --  1.4*  --   --   --   PHOS  --  2.0*  --   --   --    Liver Function Tests:  Recent Labs Lab 05/07/12 1250 05/07/12 2335 05/08/12 0547  AST 23 22 24   ALT 19 18 16   ALKPHOS 116 110 100  BILITOT 0.7 0.9 0.6  PROT 7.0 6.7 6.1  ALBUMIN 4.1 3.9 3.6   No results found for this basename: LIPASE, AMYLASE,  in the last 168 hours No results found for this basename: AMMONIA,  in the last 168 hours CBC:  Recent Labs Lab 05/07/12 1250 05/07/12 2335 05/07/12 2348 05/08/12 0547  WBC 8.0 8.9 9.0 8.2  NEUTROABS 5.8 6.5 6.6  --   HGB 14.3 13.5 13.7 12.7  HCT 37.5 35.4* 35.7* 34.0*  MCV 83.7 83.5 83.6 84.0  PLT 220 211 207 205   Cardiac Enzymes:  Recent Labs Lab 05/07/12 1250 05/07/12 2335 05/08/12 0547  TROPONINI <0.30 <0.30 <0.30   BNP (last 3 results)  Recent Labs  05/07/12 2335  PROBNP 752.3*   CBG:  Recent Labs Lab 05/08/12 0748 05/09/12 0748  GLUCAP 101* 76    Recent Results (from the past 240 hour(s))  MRSA PCR SCREENING     Status: None   Collection Time    05/08/12  6:40 AM      Result Value Range Status   MRSA by PCR NEGATIVE  NEGATIVE Final   Comment:            The GeneXpert MRSA Assay (FDA     approved for NASAL specimens     only), is one component of a     comprehensive MRSA colonization     surveillance program. It is not     intended to diagnose MRSA     infection nor to guide or     monitor treatment for     MRSA infections.     Studies: No results found.  Scheduled Meds: . amLODipine  10 mg Oral Daily  . aspirin  325 mg Oral Daily  . enoxaparin (LOVENOX) injection  40 mg Subcutaneous Q24H  . feeding supplement  237 mL Oral Q24H  . latanoprost  1 drop Both  Eyes QHS  . metoprolol succinate  100 mg Oral Daily  . metoprolol succinate  25 mg Oral QHS  . multivitamin with minerals  1 tablet Oral Daily  . polyethylene glycol  17 g Oral Daily  . sodium chloride  3 mL Intravenous Q12H  . traMADol  50 mg Oral TID  . valACYclovir  1,000 mg Oral TID   Continuous Infusions: . sodium chloride 20 mL/hr at 05/09/12 1332    Principal Problem:   Chest pain Active Problems:   Hyponatremia   Hypokalemia   HTN (hypertension)   Herpes zoster    Time spent: 35 min    [facility] S  [facility] Pager [phone]. If 7PM-7AM, please contact night-coverage at [email or web address], password [facility] 05/10/2012, 10:50 AM  LOS: 3 days

## 2012-05-11 LAB — BASIC METABOLIC PANEL
BUN: 13 mg/dL (ref 6–23)
Chloride: 95 mEq/L — ABNORMAL LOW (ref 96–112)
GFR calc Af Amer: >90 mL/min (ref >90)
GFR calc non Af Amer: 81 mL/min — ABNORMAL LOW (ref >90)
Glucose, Bld: 95 mg/dL (ref 70–99)
Potassium: 4.5 mEq/L (ref 3.5–5.1)
Sodium: 127 mEq/L — ABNORMAL LOW (ref 135–145)

## 2012-05-11 MED ORDER — MAGNESIUM SULFATE 40 MG/ML IJ SOLN
2.0000 g | Freq: Once | INTRAMUSCULAR | Status: AC
  Administered 2012-05-11: 2 g via INTRAVENOUS
  Filled 2012-05-11: qty 50

## 2012-05-11 MED ORDER — VALACYCLOVIR HCL 1 G PO TABS: 1000.0000 mg | ORAL_TABLET | Freq: Three times a day (TID) | ORAL | Status: AC

## 2012-05-11 MED ORDER — MAGNESIUM SULFATE BOLUS VIA INFUSION: 2.0000 g | Freq: Once | INTRAVENOUS | Status: DC

## 2012-05-11 MED ORDER — AMLODIPINE BESYLATE 10 MG PO TABS: 10.0000 mg | ORAL_TABLET | Freq: Every day | ORAL | Status: DC

## 2012-05-11 NOTE — Progress Notes (Signed)
Dr. Sharl Ma at bedside, spoke with pt's daughter [name]. DC restraints as pt has Recruitment consultant and is calm at present time. Plan to DC patient back to independent living with her husband, Clinical research associate spoke with pt's dgt as well. [name] feels pt's mentation will clear once is she back with her husband.

## 2012-05-11 NOTE — Progress Notes (Signed)
Call to charge nurse for safety sitter, as pt is continually attempting to get out of bed or chair, alarms are on. Pt confused as she was at shift change which was reported as her baseline. Spoke with pt's dgt and she states that when she speaks with her mother it just makes her more confused and agitated.   Charge nurse assigned NT to safety sit to pt.

## 2012-05-11 NOTE — Discharge Summary (Signed)
Physician Discharge Summary  [name]:[id number] DOB: 04/15/1922 DOA: 05/07/2012  PCP: Miguel Aschoff, MD  Admit date: 05/07/2012 Discharge date: 05/11/2012  Time spent: 50 minutes  Recommendations for Outpatient Follow-up:  1. Follow up BMP to check serum sodium, TSH  in one week.  2. Follow up PCP in one week  Discharge Diagnoses:  Principal Problem:   Chest pain Active Problems:   Hyponatremia   Hypokalemia   HTN (hypertension)   Herpes zoster   Discharge Condition: Stable  Diet recommendation: Low salt diet  Filed Weights   05/09/12 0700 05/10/12 0524 05/11/12 0500  Weight: 44.8 kg (98 lb 12.3 oz) 46 kg (101 lb 6.6 oz) 45.2 kg (99 lb 10.4 oz)    History of present illness:  79 year old female who is from an independent living facility, medical history of colon cancer (in 2009 but now in remission), status post chemotherapy (under Dr. Lonell Face care), Paget's bone disease and hypertension who presented to [facility] ED 05/07/2012 with chest pain. Per ED physician, patient presented with worsening weakness and rash for past few days. However, patient reports she came to [facility] to visit her husband because he was apparently in ED. She started to experience chest pain in retrosternal area for which reason she is being admitted. Patient has repeatedly said she has a chest pain. This is very questionable history and patient could not tell the date or year so i think she is not a reliable historian at this time.  In ED, patient was found to have hyponatremia of 119 and potassium of 3.4. Her 12 lead EKG showed sinus rhythm.      Hospital Course:   Chest pain Resolved, most likely from herpes Zoster lesions, her cardiac enzymes were negative, patient was started on valtrex 1000 mg po tid in the hospital , and will need to complete seven days on valtrex.  Herpes zoster Patient will be discharged home on Valtrex 1000 mg po TID Lesions are now clearing.  Hyponatremia Patient came with  sodium of 119 and it has ow improved to 127. 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 If continues to be low, she may need endocrine evaluation.  Hypertension HCTZ was discontinued and she was continued on Metoprolol Amlodipine 10 mg  has added to the regimen and she seems to have better control of her Blood pressure.  Agitation Patient has possible underlying dementia as she got confused at night, and becomes more pleasant during day time. Will need further evaluation as outpatient  if the behavior continues   Procedures:  None  Consultations:  None  Discharge Exam: Filed Vitals:   05/10/12 1110 05/10/12 1325 05/10/12 2322 05/11/12 0500  BP: 148/83 118/82 134/70   Pulse: 62 63 66   Temp:  98 F (36.7 C) 97.6 F (36.4 C)   TempSrc:  Oral Oral   Resp:  20 20   Height:      Weight:    45.2 kg (99 lb 10.4 oz)  SpO2:   97%     General: Appear in no acute distress Cardiovascular: s1s2 RRR Respiratory: Clear bilaterally Ext : no edema   Discharge Instructions  Discharge Orders   Future Orders Complete By Expires     Diet - low sodium heart healthy  As directed     Increase  activity slowly  As directed         Medication List    STOP taking these medications       hydrochlorothiazide 12.5 MG capsule  Commonly known as:  MICROZIDE      TAKE these medications       amLODipine 10 MG tablet  Commonly known as:  NORVASC  Take 1 tablet (10 mg total) by mouth daily.     aspirin 325 MG buffered tablet  Take 325 mg by mouth daily.     hydrOXYzine 10 MG tablet  Commonly known as:  ATARAX/VISTARIL  Take 5-10 mg by mouth 3 (three) times daily as needed for anxiety.     latanoprost 0.005 % ophthalmic solution  Commonly known as:  XALATAN  Place 1 drop into both eyes at bedtime.     metoprolol succinate 50 MG  24 hr tablet  Commonly known as:  TOPROL-XL  Take 100 mg by mouth daily. Take with or immediately following a meal.     metoprolol succinate 25 MG 24 hr tablet  Commonly known as:  TOPROL-XL  Take 25 mg by mouth at bedtime.     multivitamin with minerals Tabs  Take 1 tablet by mouth daily.     polyethylene glycol packet  Commonly known as:  MIRALAX / GLYCOLAX  Take 17 g by mouth daily.     promethazine 12.5 MG tablet  Commonly known as:  PHENERGAN  Take 12.5 mg by mouth every 6 (six) hours as needed for nausea.     risedronate 35 MG tablet  Commonly known as:  ACTONEL  Take 35 mg by mouth every 7 (seven) days. with water on empty stomach, nothing by mouth or lie down for next 30 minutes.     traMADol 50 MG tablet  Commonly known as:  ULTRAM  Take 50 mg by mouth 3 (three) times daily. pain     valACYclovir 1000 MG tablet  Commonly known as:  VALTREX  Take 1 tablet (1,000 mg total) by mouth 3 (three) times daily.           Follow-up Information   Follow up with Alysia Penna, MD In 1 week.   Contact information:   [address] [place] [address] [phone]        The results of significant diagnostics from this hospitalization (including imaging, microbiology, ancillary and laboratory) are listed below for reference.    Significant Diagnostic Studies: Dg Chest Port 1 View  05/07/2012  *RADIOLOGY REPORT*  Clinical Data: Chest pain, weakness and hypertension.  PORTABLE CHEST - 1 VIEW  Comparison: Chest x-ray from an abdominal series dated 03/29/2012.  Findings: Stable chronic lung disease.  No acute infiltrate, pulmonary edema or pleural fluid is identified.  Heart size is within normal limits.  Stable tortuosity of the thoracic aorta.  IMPRESSION: Stable chronic lung disease.  No acute findings.   Original Report Authenticated By: Irish Lack, M.D.     Microbiology: Recent Results (from the past 240 hour(s))  MRSA PCR SCREENING     Status: None    Collection Time    05/08/12  6:40 AM      Result Value Range Status   MRSA by PCR NEGATIVE  NEGATIVE Final   Comment:            The GeneXpert MRSA Assay (FDA     approved for NASAL specimens     only), is one component of a     comprehensive MRSA  colonization     surveillance program. It is not     intended to diagnose MRSA     infection nor to guide or     monitor treatment for     MRSA infections.     Labs: Basic Metabolic Panel:  Recent Labs Lab 05/07/12 1250 05/07/12 2335 05/08/12 0547 05/09/12 0559 05/10/12 0500 05/10/12 0524 05/11/12 0403  NA 119* 122* 126* 127* 123*  --  127*  K 3.4* 2.9* 3.6 3.7 3.2*  --  4.5  CL 81* 84* 90* 92* 92*  --  95*  CO2 25 25 26 24 24   --  25  GLUCOSE 118* 124* 101* 76 87  --  95  BUN 19 13 12 12 16   --  13  CREATININE 0.57 0.49* 0.53 0.57 0.63  --  0.57  CALCIUM 10.4 9.9 9.8 9.8 9.2  --  10.4  MG  --  1.4*  --   --   --  1.4*  --   PHOS  --  2.0*  --   --   --   --   --    Liver Function Tests:  Recent Labs Lab 05/07/12 1250 05/07/12 2335 05/08/12 0547  AST 23 22 24   ALT 19 18 16   ALKPHOS 116 110 100  BILITOT 0.7 0.9 0.6  PROT 7.0 6.7 6.1  ALBUMIN 4.1 3.9 3.6   No results found for this basename: LIPASE, AMYLASE,  in the last 168 hours No results found for this basename: AMMONIA,  in the last 168 hours CBC:  Recent Labs Lab 05/07/12 1250 05/07/12 2335 05/07/12 2348 05/08/12 0547  WBC 8.0 8.9 9.0 8.2  NEUTROABS 5.8 6.5 6.6  --   HGB 14.3 13.5 13.7 12.7  HCT 37.5 35.4* 35.7* 34.0*  MCV 83.7 83.5 83.6 84.0  PLT 220 211 207 205   Cardiac Enzymes:  Recent Labs Lab 05/07/12 1250 05/07/12 2335 05/08/12 0547  TROPONINI <0.30 <0.30 <0.30   BNP: BNP (last 3 results)  Recent Labs  05/07/12 2335  PROBNP 752.3*   CBG:  Recent Labs Lab 05/08/12 0748 05/09/12 0748  GLUCAP 101* 76       Signed:  LAMA,GAGAN S  [facility] 05/11/2012, 7:59 AM

## 2012-05-13 LAB — GLUCOSE, CAPILLARY: Glucose-Capillary: 94 mg/dL (ref 70–99)

## 2012-05-14 ENCOUNTER — Encounter (HOSPITAL_COMMUNITY): Payer: Self-pay | Admitting: Emergency Medicine

## 2012-05-14 ENCOUNTER — Emergency Department (HOSPITAL_COMMUNITY)
Admission: EM | Admit: 2012-05-14 | Discharge: 2012-05-15 | Disposition: A | Discharge status: Home / Self Care | Payer: Medicare Other | Attending: Emergency Medicine | Admitting: Emergency Medicine

## 2012-05-14 DIAGNOSIS — Z7982 Long term (current) use of aspirin: Secondary | ICD-10-CM | POA: Insufficient documentation

## 2012-05-14 DIAGNOSIS — B029 Zoster without complications: Secondary | ICD-10-CM | POA: Insufficient documentation

## 2012-05-14 DIAGNOSIS — Z8739 Personal history of other diseases of the musculoskeletal system and connective tissue: Secondary | ICD-10-CM | POA: Insufficient documentation

## 2012-05-14 DIAGNOSIS — R079 Chest pain, unspecified: Secondary | ICD-10-CM | POA: Insufficient documentation

## 2012-05-14 DIAGNOSIS — Z79899 Other long term (current) drug therapy: Secondary | ICD-10-CM | POA: Insufficient documentation

## 2012-05-14 DIAGNOSIS — I10 Essential (primary) hypertension: Secondary | ICD-10-CM | POA: Insufficient documentation

## 2012-05-14 NOTE — ED Notes (Signed)
[phone] home phone number for pt's daughter.

## 2012-05-14 NOTE — ED Notes (Signed)
Pt from [place], c/o continued shingle pain.  Pt diagnosed with shingles on 05/07/12.  All wounds are crusted over at this time.  Pt given Valtrex Rx and told to follow up with Dr. Link Snuffer today.

## 2012-05-14 NOTE — ED Notes (Signed)
ZOX:[id number]<AV> Expected date:<BR> Expected time:<BR> Means of arrival:<BR> Comments:<BR> EMS/79 yo from SNF with right chest wall pain-had shingles-area is dried and healing

## 2012-05-15 LAB — URINALYSIS, ROUTINE W REFLEX MICROSCOPIC
Bilirubin Urine: NEGATIVE
Glucose, UA: NEGATIVE mg/dL
Hgb urine dipstick: NEGATIVE
Ketones, ur: NEGATIVE mg/dL
Protein, ur: NEGATIVE mg/dL

## 2012-05-15 LAB — COMPREHENSIVE METABOLIC PANEL
ALT: 24 U/L (ref 0–35)
AST: 25 U/L (ref 0–37)
Alkaline Phosphatase: 106 U/L (ref 39–117)
CO2: 25 mEq/L (ref 19–32)
Chloride: 92 mEq/L — ABNORMAL LOW (ref 96–112)
GFR calc Af Amer: 71 mL/min — ABNORMAL LOW (ref >90)
GFR calc non Af Amer: 62 mL/min — ABNORMAL LOW (ref >90)
Glucose, Bld: 101 mg/dL — ABNORMAL HIGH (ref 70–99)
Potassium: 4.1 mEq/L (ref 3.5–5.1)
Sodium: 128 mEq/L — ABNORMAL LOW (ref 135–145)
Total Bilirubin: 0.4 mg/dL (ref 0.3–1.2)

## 2012-05-15 LAB — CBC WITH DIFFERENTIAL/PLATELET
Basophils Absolute: 0 10*3/uL (ref 0.0–0.1)
HCT: 38.2 % (ref 36.0–46.0)
Lymphocytes Relative: 26 % (ref 12–46)
Lymphs Abs: 2.5 10*3/uL (ref 0.7–4.0)
MCV: 87.6 fL (ref 78.0–100.0)
Neutro Abs: 6.3 10*3/uL (ref 1.7–7.7)
Platelets: 254 10*3/uL (ref 150–400)
RBC: 4.36 MIL/uL (ref 3.87–5.11)
RDW: 12.1 % (ref 11.5–15.5)
WBC: 9.7 10*3/uL (ref 4.0–10.5)

## 2012-05-15 NOTE — ED Notes (Signed)
EDP at bedside at this time speaking to pt and family

## 2012-05-15 NOTE — ED Provider Notes (Signed)
History     CSN: [id number]  Arrival date & time 05/14/12  2210   First MD Initiated Contact with Patient 05/14/12 2302      Chief Complaint  Patient presents with  . Herpes Zoster    pain    (Consider location/radiation/quality/duration/timing/severity/associated sxs/prior treatment) HPI The patient presents from her nursing facility with reported concern over ongoing pain in her back near an area that she was recently diagnosed with shingles. On my exam the patient has no significant ongoing concerns, and when asked specifically, how we could best assessed her, she cannot provide a specific answer. She does, with specific worsening endorse pain in her back, and her husband states that the patient has had chest pain. The patient herself denies chest pain. She denies ongoing vomiting, confusion, fever, dyspnea. She has been taking tramadol for pain control, seemingly with minimal benefit. Past Medical History  Diagnosis Date  . Paget's bone disease   . Hypertension     Past Surgical History  Procedure Laterality Date  . Hip arthroplasty      History reviewed. No pertinent family history.  History  Substance Use Topics  . Smoking status: Never Smoker   . Smokeless tobacco: Never Used  . Alcohol Use: No    OB History   Grav Para Term Preterm Abortions TAB SAB Ect Mult Living                  Review of Systems  All other systems reviewed and are negative.    Allergies  Percocet  Home Medications   Current Outpatient Rx  Name  Route  Sig  Dispense  Refill  . amLODipine (NORVASC) 10 MG tablet   Oral   Take 10 mg by mouth every morning.         Marland Kitchen aspirin 325 MG buffered tablet   Oral   Take 325 mg by mouth every morning.          . hydrOXYzine (ATARAX/VISTARIL) 10 MG tablet   Oral   Take 2.5-5 mg by mouth 2 (two) times daily. Take 2.5mg  twice daily as scheduled. Take 2.5-5mg  three times daily as needed for anxiety         . latanoprost (XALATAN)  0.005 % ophthalmic solution   Both Eyes   Place 1 drop into both eyes every evening.          Marland Kitchen lisinopril (PRINIVIL,ZESTRIL) 40 MG tablet   Oral   Take 40 mg by mouth every morning.         . metoprolol succinate (TOPROL-XL) 50 MG 24 hr tablet   Oral   Take 25-100 mg by mouth 2 (two) times daily. Take 100mg  in the morning. Take 25mg  in the evening         . Multiple Vitamin (MULTIVITAMIN WITH MINERALS) TABS   Oral   Take 1 tablet by mouth every morning.          . polyethylene glycol (MIRALAX / GLYCOLAX) packet   Oral   Take 17 g by mouth every morning.          . promethazine (PHENERGAN) 12.5 MG tablet   Oral   Take 6.25 mg by mouth every 6 (six) hours as needed for nausea.          . risedronate (ACTONEL) 35 MG tablet   Oral   Take 35 mg by mouth every 7 (seven) days. On Tuesdays         . traMADol (  ULTRAM) 50 MG tablet   Oral   Take 50 mg by mouth 2 (two) times daily. pain           BP 134/72  Pulse 61  Temp(Src) 98.1 F (36.7 C) (Oral)  Resp 16  SpO2 97%  Physical Exam  Constitutional: She is oriented to person, place, and time. She appears well-developed.  HENT:  Head: Normocephalic.  Eyes: Conjunctivae and EOM are normal. No scleral icterus.  Neck: No thyromegaly present.  Cardiovascular: Normal rate and regular rhythm.   Murmur heard. Pulmonary/Chest: No stridor. She has no wheezes. She has no rales. She exhibits no tenderness.  Abdominal: She exhibits no distension. There is no tenderness. There is no rebound.  Musculoskeletal: Normal range of motion. She exhibits no edema.  Neurological: She is oriented to person, place, and time. Coordination normal.  Skin: Rash noted. No erythema.  About the upper thoracic area there is a patch of crusted lesions, with mild surrounding erythema, no discharge, no bleeding  Psychiatric: She has a normal mood and affect. Her behavior is normal.    ED Course  Procedures (including critical care  time)  Labs Reviewed  COMPREHENSIVE METABOLIC PANEL - Abnormal; Notable for the following:    Sodium 128 (*)    Chloride 92 (*)    Glucose, Bld 101 (*)    BUN 25 (*)    GFR calc non Af Amer 62 (*)    GFR calc Af Amer 71 (*)    All other components within normal limits  CBC WITH DIFFERENTIAL  TROPONIN I  URINALYSIS, ROUTINE W REFLEX MICROSCOPIC   No results found.   1. Chest pain   2. Herpes zoster      Date: 05/15/2012 - EMS  Rate: 60  Rhythm: normal sinus rhythm  QRS Axis: normal  Intervals: normal  ST/T Wave abnormalities: normal  Conduction Disutrbances:none  Narrative Interpretation:   Old EKG Reviewed: none available  PVC, mild j-point elevation, otherwise unremarkable.  1:27 AM On repeat exam the patient appears calm, in no distress  MDM  This pleasant elderly female presents with concern of ongoing pain, though on my exam she denies any significant concern.  She is medically stable, in no distress.  The patient's report of prior chest pain, was evaluated with labs.  Additionally, she was recently found to be hyponatremic.  Today's labs are largely reassuring, and absent any distress, she is appropriate for discharge.  I spoke with the patient and her husband about the need for primary care followup for management of likely postherpetic neuralgia.  She was discharged in stable condition.  She has a listed he allergies to narcotics, and thus was counseled on the need to stay on tramadol, pending additional evaluation by her physician      Gerhard Munch, MD 05/15/12 [phone]

## 2012-09-27 ENCOUNTER — Emergency Department (HOSPITAL_COMMUNITY)
Admission: EM | Admit: 2012-09-27 | Discharge: 2012-09-27 | Disposition: A | Discharge status: Home / Self Care | Payer: Medicare Other | Attending: Emergency Medicine | Admitting: Emergency Medicine

## 2012-09-27 ENCOUNTER — Emergency Department (HOSPITAL_COMMUNITY): Payer: Medicare Other

## 2012-09-27 ENCOUNTER — Encounter (HOSPITAL_COMMUNITY): Payer: Self-pay | Admitting: Emergency Medicine

## 2012-09-27 DIAGNOSIS — W06XXXA Fall from bed, initial encounter: Secondary | ICD-10-CM | POA: Insufficient documentation

## 2012-09-27 DIAGNOSIS — S0003XA Contusion of scalp, initial encounter: Secondary | ICD-10-CM | POA: Insufficient documentation

## 2012-09-27 DIAGNOSIS — I10 Essential (primary) hypertension: Secondary | ICD-10-CM | POA: Insufficient documentation

## 2012-09-27 DIAGNOSIS — Z23 Encounter for immunization: Secondary | ICD-10-CM | POA: Insufficient documentation

## 2012-09-27 DIAGNOSIS — S0990XA Unspecified injury of head, initial encounter: Secondary | ICD-10-CM | POA: Insufficient documentation

## 2012-09-27 DIAGNOSIS — Z79899 Other long term (current) drug therapy: Secondary | ICD-10-CM | POA: Insufficient documentation

## 2012-09-27 DIAGNOSIS — Z7982 Long term (current) use of aspirin: Secondary | ICD-10-CM | POA: Insufficient documentation

## 2012-09-27 DIAGNOSIS — Z7983 Long term (current) use of bisphosphonates: Secondary | ICD-10-CM | POA: Insufficient documentation

## 2012-09-27 DIAGNOSIS — W1809XA Striking against other object with subsequent fall, initial encounter: Secondary | ICD-10-CM | POA: Insufficient documentation

## 2012-09-27 DIAGNOSIS — S51809A Unspecified open wound of unspecified forearm, initial encounter: Secondary | ICD-10-CM | POA: Insufficient documentation

## 2012-09-27 DIAGNOSIS — Y9389 Activity, other specified: Secondary | ICD-10-CM | POA: Insufficient documentation

## 2012-09-27 DIAGNOSIS — M889 Osteitis deformans of unspecified bone: Secondary | ICD-10-CM | POA: Insufficient documentation

## 2012-09-27 DIAGNOSIS — W19XXXA Unspecified fall, initial encounter: Secondary | ICD-10-CM

## 2012-09-27 DIAGNOSIS — S0083XA Contusion of other part of head, initial encounter: Secondary | ICD-10-CM

## 2012-09-27 DIAGNOSIS — IMO0002 Reserved for concepts with insufficient information to code with codable children: Secondary | ICD-10-CM

## 2012-09-27 DIAGNOSIS — Y9289 Other specified places as the place of occurrence of the external cause: Secondary | ICD-10-CM | POA: Insufficient documentation

## 2012-09-27 LAB — CBC
HCT: 39.4 % (ref 36.0–46.0)
MCH: 31.4 pg (ref 26.0–34.0)
MCV: 89.5 fL (ref 78.0–100.0)
RDW: 12.7 % (ref 11.5–15.5)
WBC: 7.3 10*3/uL (ref 4.0–10.5)

## 2012-09-27 LAB — URINALYSIS, ROUTINE W REFLEX MICROSCOPIC
Bilirubin Urine: NEGATIVE
Glucose, UA: NEGATIVE mg/dL
Ketones, ur: NEGATIVE mg/dL
Nitrite: NEGATIVE
pH: 5 (ref 5.0–8.0)

## 2012-09-27 LAB — BASIC METABOLIC PANEL
BUN: 20 mg/dL (ref 6–23)
Calcium: 10.7 mg/dL — ABNORMAL HIGH (ref 8.4–10.5)
Chloride: 103 mEq/L (ref 96–112)
Creatinine, Ser: 0.82 mg/dL (ref 0.50–1.10)
GFR calc Af Amer: 72 mL/min — ABNORMAL LOW (ref >90)

## 2012-09-27 LAB — URINE MICROSCOPIC-ADD ON

## 2012-09-27 MED ORDER — TETANUS-DIPHTHERIA TOXOIDS TD 5-2 LFU IM INJ
0.5000 mL | INJECTION | Freq: Once | INTRAMUSCULAR | Status: AC
  Administered 2012-09-27: 0.5 mL via INTRAMUSCULAR
  Filled 2012-09-27: qty 0.5

## 2012-09-27 MED ORDER — FENTANYL CITRATE 0.05 MG/ML IJ SOLN
25.0000 ug | Freq: Once | INTRAMUSCULAR | Status: AC
  Administered 2012-09-27: 25 ug via INTRAVENOUS
  Filled 2012-09-27: qty 2

## 2012-09-27 NOTE — ED Provider Notes (Signed)
TIME SEEN: 7:06 AM  CHIEF COMPLAINT:  fall   HPI: Patient is an 79 year old female with a history of hypertension who presents the emergency department after a fall this morning. She reports that she got out of bed and fell and hit the right side of her face on her night table. No loss of consciousness. She was able to get up and walk around after the event. She is complaining of right facial pain. She states she is not sure why she fell. She denies any chest pain, shortness of breath, palpitations, dizziness, numbness, tingling or focal weakness that caused her to fall or currently. No recent fever, vomiting or diarrhea. She does not think that she tripped.  ROS: See HPI Constitutional: no fever  Eyes: no drainage  ENT: no runny nose   Cardiovascular:  no chest pain  Resp: no SOB  GI: no vomiting GU: no dysuria Integumentary: no rash  Allergy: no hives  Musculoskeletal: no leg swelling  Neurological: no slurred speech ROS otherwise negative  PAST MEDICAL HISTORY/PAST SURGICAL HISTORY:  Past Medical History  Diagnosis Date  . Paget's bone disease   . Hypertension     MEDICATIONS:  Prior to Admission medications   Medication Sig Start Date End Date Taking? Authorizing Provider  amLODipine (NORVASC) 10 MG tablet Take 10 mg by mouth every morning. 05/11/12   Meredeth Ide, MD  aspirin 325 MG buffered tablet Take 325 mg by mouth every morning.     Historical Provider, MD  hydrOXYzine (ATARAX/VISTARIL) 10 MG tablet Take 2.5-5 mg by mouth 2 (two) times daily. Take 2.5mg  twice daily as scheduled. Take 2.5-5mg  three times daily as needed for anxiety    Historical Provider, MD  latanoprost (XALATAN) 0.005 % ophthalmic solution Place 1 drop into both eyes every evening.     Historical Provider, MD  lisinopril (PRINIVIL,ZESTRIL) 40 MG tablet Take 40 mg by mouth every morning.    Historical Provider, MD  metoprolol succinate (TOPROL-XL) 50 MG 24 hr tablet Take 25-100 mg by mouth 2 (two) times  daily. Take 100mg  in the morning. Take 25mg  in the evening    Historical Provider, MD  Multiple Vitamin (MULTIVITAMIN WITH MINERALS) TABS Take 1 tablet by mouth every morning.     Historical Provider, MD  polyethylene glycol (MIRALAX / GLYCOLAX) packet Take 17 g by mouth every morning.     Historical Provider, MD  promethazine (PHENERGAN) 12.5 MG tablet Take 6.25 mg by mouth every 6 (six) hours as needed for nausea.     Historical Provider, MD  risedronate (ACTONEL) 35 MG tablet Take 35 mg by mouth every 7 (seven) days. On Tuesdays    Historical Provider, MD  traMADol (ULTRAM) 50 MG tablet Take 50 mg by mouth 2 (two) times daily. pain    Historical Provider, MD    ALLERGIES:  Allergies  Allergen Reactions  . Percocet [Oxycodone-Acetaminophen]     unknown    SOCIAL HISTORY:  History  Substance Use Topics  . Smoking status: Never Smoker   . Smokeless tobacco: Never Used  . Alcohol Use: No    FAMILY HISTORY: History reviewed. No pertinent family history.  EXAM: BP 182/94  Pulse 71  Temp(Src) 98.6 F (37 C) (Oral)  Resp 18  SpO2 96% CONSTITUTIONAL: Alert and oriented x 3 and responds appropriately to questions. Well-appearing; well-nourished; GCS 15 HEAD: Normocephalic; patient has periorbital ecchymosis and swelling to the right eye EYES: Conjunctivae clear, PERRL, EOMI ENT: normal nose; no rhinorrhea; moist mucous  membranes; pharynx without lesions noted; no dental injury; no hemotypanum; no septal hematoma NECK: Supple, no meningismus, no LAD; no midline spinal tenderness, step-off or deformity CARD: RRR; S1 and S2 appreciated; no murmurs, no clicks, no rubs, no gallops RESP: Normal chest excursion without splinting or tachypnea; breath sounds clear and equal bilaterally; no wheezes, no rhonchi, no rales; chest wall stable, nontender to palpation ABD/GI: Normal bowel sounds; non-distended; soft, non-tender, no rebound, no guarding PELVIS:  stable, nontender to palpation BACK:   Significant kyphosis of the thoracic spine otherwise The back appears normal and is non-tender to palpation, there is no CVA tenderness; no midline spinal tenderness, step-off or deformity EXT: Normal ROM in all joints; non-tender to palpation; no edema; normal capillary refill; no cyanosis    SKIN: Normal color for age and race; warm, large skin tear to the right upper extremity NEURO: Moves all extremities equally, no pronator drift, sensation to light touch intact diffusely, cranial nerves II through XII intact PSYCH: The patient's mood and manner are appropriate. Grooming and personal hygiene are appropriate.  MEDICAL DECISION MAKING: Patient with fall this morning. She is on aspirin but no other anticoagulation. She is hemodynamically stable. Patient's head CT is unremarkable. Will also obtain CT of her face and neck. Will obtain basic labs, urinalysis to evaluate for possible organic causes for her fall. We'll give pain medication.   Date: 09/27/2012 9:02 AM  Rate: 81  Rhythm: normal sinus rhythm  QRS Axis: normal  Intervals: normal  ST/T Wave abnormalities: normal  Conduction Disutrbances: none  Narrative Interpretation: unremarkable; no ischemic changes, no arrhythmia, artifact present, no change compared to prior EKG     ED PROGRESS: Labs, urinalysis unremarkable. All imaging negative for intracranial injury, fracture. Patient is still hemodynamically stable. Will discharge back to home with her husband. Given return precautions. Discussed at length the possibility of delayed intracranial hemorrhage and symptoms to look out for. She still neurologically intact in the ED. Patient verbalizes understanding and is comfortable with plan.     Layla Maw Ward, DO 09/27/12 [phone]

## 2012-09-27 NOTE — ED Notes (Signed)
Per EMS , pt. From [facility] at [place] , fell as she got up from bed this morning and hit her face to night table, denies LOC. Pain at 8/10, hematoma on right eyebrow and skin tear on right forearm. Alert and oriented x3.

## 2012-09-27 NOTE — ED Notes (Signed)
Patient transported to CT 

## 2012-09-27 NOTE — ED Notes (Signed)
Bed: NW29 Expected date: 09/27/12 Expected time: 6:12 AM Means of arrival: Ambulance Comments: Bed 11, EMS, 82 F, Fall

## 2012-11-23 ENCOUNTER — Ambulatory Visit: Payer: Self-pay | Admitting: Podiatry

## 2013-05-22 ENCOUNTER — Encounter (HOSPITAL_COMMUNITY): Payer: Self-pay | Admitting: Emergency Medicine

## 2013-05-22 ENCOUNTER — Emergency Department (HOSPITAL_COMMUNITY)
Admission: EM | Admit: 2013-05-22 | Discharge: 2013-05-22 | Disposition: A | Discharge status: Home / Self Care | Payer: Medicare Other | Attending: Emergency Medicine | Admitting: Emergency Medicine

## 2013-05-22 ENCOUNTER — Emergency Department (HOSPITAL_COMMUNITY): Payer: Medicare Other

## 2013-05-22 DIAGNOSIS — S0083XA Contusion of other part of head, initial encounter: Secondary | ICD-10-CM | POA: Insufficient documentation

## 2013-05-22 DIAGNOSIS — S0003XA Contusion of scalp, initial encounter: Secondary | ICD-10-CM | POA: Insufficient documentation

## 2013-05-22 DIAGNOSIS — W010XXA Fall on same level from slipping, tripping and stumbling without subsequent striking against object, initial encounter: Secondary | ICD-10-CM | POA: Insufficient documentation

## 2013-05-22 DIAGNOSIS — Y92009 Unspecified place in unspecified non-institutional (private) residence as the place of occurrence of the external cause: Secondary | ICD-10-CM | POA: Insufficient documentation

## 2013-05-22 DIAGNOSIS — Z96649 Presence of unspecified artificial hip joint: Secondary | ICD-10-CM | POA: Insufficient documentation

## 2013-05-22 DIAGNOSIS — M889 Osteitis deformans of unspecified bone: Secondary | ICD-10-CM | POA: Insufficient documentation

## 2013-05-22 DIAGNOSIS — S1093XA Contusion of unspecified part of neck, initial encounter: Secondary | ICD-10-CM

## 2013-05-22 DIAGNOSIS — Z7983 Long term (current) use of bisphosphonates: Secondary | ICD-10-CM | POA: Insufficient documentation

## 2013-05-22 DIAGNOSIS — I10 Essential (primary) hypertension: Secondary | ICD-10-CM | POA: Insufficient documentation

## 2013-05-22 DIAGNOSIS — R609 Edema, unspecified: Secondary | ICD-10-CM | POA: Insufficient documentation

## 2013-05-22 DIAGNOSIS — Z79899 Other long term (current) drug therapy: Secondary | ICD-10-CM | POA: Insufficient documentation

## 2013-05-22 DIAGNOSIS — Z7982 Long term (current) use of aspirin: Secondary | ICD-10-CM | POA: Insufficient documentation

## 2013-05-22 DIAGNOSIS — W19XXXA Unspecified fall, initial encounter: Secondary | ICD-10-CM

## 2013-05-22 DIAGNOSIS — Y9389 Activity, other specified: Secondary | ICD-10-CM | POA: Insufficient documentation

## 2013-05-22 DIAGNOSIS — S0990XA Unspecified injury of head, initial encounter: Secondary | ICD-10-CM | POA: Insufficient documentation
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
## 2013-05-22 NOTE — ED Notes (Signed)
Bed: VE93 Expected date:  Expected time:  Means of arrival:  Comments: EMS 44F fall

## 2013-05-22 NOTE — Discharge Instructions (Signed)
Fall Prevention and Home Safety °Falls cause injuries and can affect all age groups. It is possible to prevent falls.  °HOW TO PREVENT FALLS °· Wear shoes with rubber soles that do not have an opening for your toes. °· Keep the inside and outside of your house well lit. °· Use night lights throughout your home. °· Remove clutter from floors. °· Clean up floor spills. °· Remove throw rugs or fasten them to the floor with carpet tape. °· Do not place electrical cords across pathways. °· Put grab bars by your tub, shower, and toilet. Do not use towel bars as grab bars. °· Put handrails on both sides of the stairway. Fix loose handrails. °· Do not climb on stools or stepladders, if possible. °· Do not wax your floors. °· Repair uneven or unsafe sidewalks, walkways, or stairs. °· Keep items you use a lot within reach. °· Be aware of pets. °· Keep emergency numbers next to the telephone. °· Put smoke detectors in your home and near bedrooms. °Ask your doctor what other things you can do to prevent falls. °Document Released: 10/27/2008 Document Revised: 07/02/2011 Document Reviewed: 04/02/2011 °[employer]® Patient Information ©2014 [employer]. ° °

## 2013-05-22 NOTE — ED Notes (Signed)
MD at bedside. 

## 2014-01-24 ENCOUNTER — Emergency Department (HOSPITAL_COMMUNITY)
Admission: EM | Admit: 2014-01-24 | Discharge: 2014-01-24 | Discharge status: Against Medical Advice | Payer: Medicare Other | Attending: Emergency Medicine | Admitting: Emergency Medicine

## 2014-01-24 ENCOUNTER — Encounter (HOSPITAL_COMMUNITY): Payer: Self-pay | Admitting: Emergency Medicine

## 2014-01-24 DIAGNOSIS — I10 Essential (primary) hypertension: Secondary | ICD-10-CM | POA: Diagnosis not present

## 2014-01-24 DIAGNOSIS — M79606 Pain in leg, unspecified: Secondary | ICD-10-CM | POA: Diagnosis not present

## 2014-01-24 DIAGNOSIS — R358 Other polyuria: Secondary | ICD-10-CM | POA: Insufficient documentation

## 2014-01-24 DIAGNOSIS — F0390 Unspecified dementia without behavioral disturbance: Secondary | ICD-10-CM | POA: Insufficient documentation

## 2014-01-24 NOTE — ED Notes (Signed)
Pt yelled very loudly that she wanted to leave, RN turned around and pt, pts husband and caregiver was walking out the room. Caregiver stated pt wanted to leave and unless we wanted a fight then she was going. Pt left ED.

## 2014-01-24 NOTE — ED Notes (Addendum)
Per family, patient has nocturnal polyuria, has difficulty sleeping throughout night, pain in her legs. Pt's baseline mental status is confusion, dementia. Family denies change in mental status from baseline.

## 2014-01-24 NOTE — ED Notes (Signed)
Pt yelling from room, "I want to go home." Pt then ambulated out of the department with family using her walker.

## 2014-07-26 ENCOUNTER — Emergency Department (HOSPITAL_COMMUNITY): Payer: Medicare Other

## 2014-07-26 ENCOUNTER — Emergency Department (HOSPITAL_COMMUNITY)
Admission: EM | Admit: 2014-07-26 | Discharge: 2014-07-26 | Disposition: A | Discharge status: Home / Self Care | Payer: Medicare Other | Source: Home / Self Care | Attending: Emergency Medicine | Admitting: Emergency Medicine

## 2014-07-26 ENCOUNTER — Encounter (HOSPITAL_COMMUNITY): Payer: Self-pay | Admitting: Emergency Medicine

## 2014-07-26 DIAGNOSIS — R29810 Facial weakness: Secondary | ICD-10-CM | POA: Diagnosis present

## 2014-07-26 DIAGNOSIS — Z66 Do not resuscitate: Secondary | ICD-10-CM | POA: Diagnosis present

## 2014-07-26 DIAGNOSIS — I10 Essential (primary) hypertension: Secondary | ICD-10-CM | POA: Diagnosis present

## 2014-07-26 DIAGNOSIS — R4182 Altered mental status, unspecified: Secondary | ICD-10-CM

## 2014-07-26 DIAGNOSIS — R059 Cough, unspecified: Secondary | ICD-10-CM

## 2014-07-26 DIAGNOSIS — Z79899 Other long term (current) drug therapy: Secondary | ICD-10-CM | POA: Insufficient documentation

## 2014-07-26 DIAGNOSIS — K5900 Constipation, unspecified: Secondary | ICD-10-CM

## 2014-07-26 DIAGNOSIS — T424X5A Adverse effect of benzodiazepines, initial encounter: Secondary | ICD-10-CM | POA: Diagnosis present

## 2014-07-26 DIAGNOSIS — R0989 Other specified symptoms and signs involving the circulatory and respiratory systems: Secondary | ICD-10-CM

## 2014-07-26 DIAGNOSIS — H409 Unspecified glaucoma: Secondary | ICD-10-CM | POA: Insufficient documentation

## 2014-07-26 DIAGNOSIS — Z85038 Personal history of other malignant neoplasm of large intestine: Secondary | ICD-10-CM

## 2014-07-26 DIAGNOSIS — Z8739 Personal history of other diseases of the musculoskeletal system and connective tissue: Secondary | ICD-10-CM

## 2014-07-26 DIAGNOSIS — R05 Cough: Secondary | ICD-10-CM

## 2014-07-26 DIAGNOSIS — G92 Toxic encephalopathy: Secondary | ICD-10-CM | POA: Diagnosis not present

## 2014-07-26 DIAGNOSIS — T43595A Adverse effect of other antipsychotics and neuroleptics, initial encounter: Secondary | ICD-10-CM | POA: Diagnosis present

## 2014-07-26 DIAGNOSIS — F0390 Unspecified dementia without behavioral disturbance: Secondary | ICD-10-CM | POA: Diagnosis present

## 2014-07-26 DIAGNOSIS — R531 Weakness: Secondary | ICD-10-CM | POA: Insufficient documentation

## 2014-07-26 DIAGNOSIS — E876 Hypokalemia: Secondary | ICD-10-CM | POA: Diagnosis present

## 2014-07-26 DIAGNOSIS — E860 Dehydration: Secondary | ICD-10-CM | POA: Diagnosis present

## 2014-07-26 DIAGNOSIS — E871 Hypo-osmolality and hyponatremia: Secondary | ICD-10-CM | POA: Diagnosis present

## 2014-07-26 HISTORY — DX: Malignant (primary) neoplasm, unspecified: C80.1

## 2014-07-26 HISTORY — DX: Constipation, unspecified: K59.00

## 2014-07-26 HISTORY — DX: Unspecified dementia, unspecified severity, without behavioral disturbance, psychotic disturbance, mood disturbance, and anxiety: F03.90

## 2014-07-26 HISTORY — DX: Unspecified glaucoma: H40.9

## 2014-07-26 LAB — CBC
HEMATOCRIT: 38.4 % (ref 36.0–46.0)
Hemoglobin: 13.2 g/dL (ref 12.0–15.0)
MCH: 32 pg (ref 26.0–34.0)
MCHC: 34.4 g/dL (ref 30.0–36.0)
MCV: 93 fL (ref 78.0–100.0)
PLATELETS: 156 10*3/uL (ref 150–400)
RBC: 4.13 MIL/uL (ref 3.87–5.11)
RDW: 13 % (ref 11.5–15.5)
WBC: 8.1 10*3/uL (ref 4.0–10.5)

## 2014-07-26 LAB — COMPREHENSIVE METABOLIC PANEL
ALBUMIN: 3.8 g/dL (ref 3.5–5.0)
ALK PHOS: 164 U/L — AB (ref 38–126)
ALT: 17 U/L (ref 14–54)
AST: 19 U/L (ref 15–41)
Anion gap: 7 (ref 5–15)
BILIRUBIN TOTAL: 0.8 mg/dL (ref 0.3–1.2)
BUN: 26 mg/dL — ABNORMAL HIGH (ref 6–20)
CALCIUM: 10.2 mg/dL (ref 8.9–10.3)
CO2: 24 mmol/L (ref 22–32)
Chloride: 106 mmol/L (ref 101–111)
Creatinine, Ser: 0.69 mg/dL (ref 0.44–1.00)
GLUCOSE: 108 mg/dL — AB (ref 65–99)
POTASSIUM: 4 mmol/L (ref 3.5–5.1)
SODIUM: 137 mmol/L (ref 135–145)
Total Protein: 6.9 g/dL (ref 6.5–8.1)

## 2014-07-26 LAB — URINALYSIS, ROUTINE W REFLEX MICROSCOPIC
BILIRUBIN URINE: NEGATIVE
GLUCOSE, UA: NEGATIVE mg/dL
HGB URINE DIPSTICK: NEGATIVE
Ketones, ur: NEGATIVE mg/dL
Leukocytes, UA: NEGATIVE
Nitrite: NEGATIVE
PROTEIN: NEGATIVE mg/dL
Specific Gravity, Urine: 1.02 (ref 1.005–1.030)
Urobilinogen, UA: 0.2 mg/dL (ref 0.0–1.0)
pH: 5.5 (ref 5.0–8.0)

## 2014-07-26 MED ORDER — LEVOFLOXACIN 750 MG PO TABS: 750.0000 mg | ORAL_TABLET | Freq: Every day | ORAL | Status: DC

## 2014-07-26 MED ORDER — SODIUM CHLORIDE 0.9 % IV BOLUS (SEPSIS)
1000.0000 mL | Freq: Once | INTRAVENOUS | Status: AC
  Administered 2014-07-26: 1000 mL via INTRAVENOUS

## 2014-07-26 NOTE — ED Notes (Signed)
Dillon Bjork (daughter) [phone]

## 2014-07-26 NOTE — ED Notes (Signed)
[facility] and [facility] caregiver from home instead.  Pt has hx of dementia.  Has been weaker and more confused than normal.  Concerned for both UTI and pna.  Pt has been having productive cough recently.  No coughing in triage.  NAD.  Had bloodwork done at [facility] but did not have urine done d/t pt not being able to go.

## 2014-07-26 NOTE — Discharge Instructions (Signed)
Cough, Adult   A cough is a reflex. It helps you clear your throat and airways. A cough can help heal your body. A cough can last 2 or 3 weeks (acute) or may last more than 8 weeks (chronic). Some common causes of a cough can include an infection, allergy, or a cold.  HOME CARE  · Only take medicine as told by your doctor.  · If given, take your medicines (antibiotics) as told. Finish them even if you start to feel better.  · Use a cold steam vaporizer or humidifier in your home. This can help loosen thick spit (secretions).  · Sleep so you are almost sitting up (semi-upright). Use pillows to do this. This helps reduce coughing.  · Rest as needed.  · Stop smoking if you smoke.  GET HELP RIGHT AWAY IF:  · You have yellowish-white fluid (pus) in your thick spit.  · Your cough gets worse.  · Your medicine does not reduce coughing, and you are losing sleep.  · You cough up blood.  · You have trouble breathing.  · Your pain gets worse and medicine does not help.  · You have a fever.  MAKE SURE YOU:   · Understand these instructions.  · Will watch your condition.  · Will get help right away if you are not doing well or get worse.  Document Released: 09/13/2010 Document Revised: 05/17/2013 Document Reviewed: 09/13/2010  ExitCare® Patient Information ©2015 [employer]. This information is not intended to replace advice given to you by your health care provider. Make sure you discuss any questions you have with your health care provider.

## 2014-07-26 NOTE — ED Provider Notes (Signed)
CSN: [id number]     Arrival date & time 07/26/14  1616 History   First MD Initiated Contact with Patient 07/26/14 1834     Chief Complaint  Patient presents with  . Altered Mental Status     (Consider location/radiation/quality/duration/timing/severity/associated sxs/prior Treatment) HPI Comments: Patient presents to the emergency department for evaluation of generalized weakness. Patient referred to the ER by primary care physician. Patient reportedly has been experiencing cough and chest congestion over the last couple of days. Family has noticed increased confusion over her baseline. PCP wanted patient worked up for pneumonia and UTI.  Patient is a 81 y.o. female presenting with altered mental status.  Altered Mental Status Presenting symptoms: confusion     Past Medical History  Diagnosis Date  . Paget's bone disease   . Hypertension   . Cancer     colon  . Glaucoma   . Constipation   . Dementia    Past Surgical History  Procedure Laterality Date  . Hip arthroplasty     No family history on file. History  Substance Use Topics  . Smoking status: Never Smoker   . Smokeless tobacco: Never Used  . Alcohol Use: No   OB History    No data available     Review of Systems  Respiratory: Positive for cough.   Psychiatric/Behavioral: Positive for confusion.  All other systems reviewed and are negative.     Allergies  Percocet  Home Medications   Prior to Admission medications   Medication Sig Start Date End Date Taking? Authorizing Provider  amLODipine (NORVASC) 10 MG tablet Take 10 mg by mouth every morning. 05/11/12  Yes Oswald Hillock, MD  Cholecalciferol (VITAMIN D) 2000 UNITS CAPS Take 2,000 Units by mouth daily.   Yes Historical Provider, MD  docusate sodium (COLACE) 100 MG capsule Take 100 mg by mouth daily as needed for mild constipation.   Yes Historical Provider, MD  donepezil (ARICEPT) 10 MG tablet Take 10 mg by mouth at bedtime.   Yes Historical Provider,  MD  DULoxetine (CYMBALTA) 20 MG capsule Take 20 mg by mouth 2 (two) times daily.   Yes Historical Provider, MD  latanoprost (XALATAN) 0.005 % ophthalmic solution Place 1 drop into both eyes every evening.    Yes Historical Provider, MD  LORazepam (ATIVAN) 0.5 MG tablet Take 0.5 mg by mouth 2 (two) times daily as needed for anxiety.   Yes Historical Provider, MD  memantine (NAMENDA XR) 28 MG CP24 24 hr capsule Take 28 mg by mouth daily.   Yes Historical Provider, MD  metoprolol (LOPRESSOR) 50 MG tablet Take 50-100 mg by mouth 2 (two) times daily. 50 mg in the morning and 100 mg at night   Yes Historical Provider, MD  Multiple Vitamin (MULTIVITAMIN WITH MINERALS) TABS Take 1 tablet by mouth every morning.    Yes Historical Provider, MD  polyethylene glycol (MIRALAX / GLYCOLAX) packet Take 17 g by mouth daily as needed for moderate constipation.    Yes Historical Provider, MD  Probiotic Product (ALIGN) 4 MG CAPS Take 1 capsule by mouth daily.   Yes Historical Provider, MD  risperiDONE (RISPERDAL) 0.5 MG tablet Take 0.25-0.5 mg by mouth 2 (two) times daily. Half in the morning and whole tab at night   Yes Historical Provider, MD  risedronate (ACTONEL) 35 MG tablet Take 35 mg by mouth every 7 (seven) days. On Tuesdays    Historical Provider, MD   BP 121/68 mmHg  Pulse 60  Temp(Src)  98.5 F (36.9 C) (Oral)  Resp 20  Ht 5\' 2"  (1.575 m)  Wt 109 lb (49.442 kg)  BMI 19.93 kg/m2  SpO2 94% Physical Exam  Constitutional: She is oriented to person, place, and time. She appears well-developed and well-nourished. No distress.  HENT:  Head: Normocephalic and atraumatic.  Right Ear: Hearing normal.  Left Ear: Hearing normal.  Nose: Nose normal.  Mouth/Throat: Oropharynx is clear and moist and mucous membranes are normal.  Eyes: Conjunctivae and EOM are normal. Pupils are equal, round, and reactive to light.  Neck: Normal range of motion. Neck supple.  Cardiovascular: Regular rhythm, S1 normal and S2  normal.  Exam reveals no gallop and no friction rub.   No murmur heard. Pulmonary/Chest: Effort normal and breath sounds normal. No respiratory distress. She exhibits no tenderness.  Abdominal: Soft. Normal appearance and bowel sounds are normal. There is no hepatosplenomegaly. There is no tenderness. There is no rebound, no guarding, no tenderness at McBurney's point and negative Murphy's sign. No hernia.  Musculoskeletal: Normal range of motion.  Neurological: She is alert and oriented to person, place, and time. She has normal strength. No cranial nerve deficit or sensory deficit. Coordination normal. GCS eye subscore is 4. GCS verbal subscore is 5. GCS motor subscore is 6.  Skin: Skin is warm, dry and intact. No rash noted. No cyanosis.  Psychiatric: She has a normal mood and affect. Her speech is normal and behavior is normal. Thought content normal.  Nursing note and vitals reviewed.   ED Course  Procedures (including critical care time) Labs Review Labs Reviewed  COMPREHENSIVE METABOLIC PANEL - Abnormal; Notable for the following:    Glucose, Bld 108 (*)    BUN 26 (*)    Alkaline Phosphatase 164 (*)    All other components within normal limits  CBC  URINALYSIS, ROUTINE W REFLEX MICROSCOPIC (NOT AT [facility])    Imaging Review Ct Head Wo Contrast  07/26/2014   CLINICAL DATA:  81 year old female with dementia and confusion  EXAM: CT HEAD WITHOUT CONTRAST  TECHNIQUE: Contiguous axial images were obtained from the base of the skull through the vertex without intravenous contrast.  COMPARISON:  CT dated 05/22/2013  FINDINGS: The ventricles are dilated and the sulci are prominent compatible with age-related atrophy. Periventricular and deep white matter hypodensities represent chronic microvascular ischemic changes. There is no intracranial hemorrhage. No mass effect or midline shift identified.  The visualized paranasal sinuses and mastoid air cells are well aerated. The calvarium is intact.  Stable left temporal calvarial focal lucency is incompletely characterized. MRI may provide better evaluation if clinically indicated.  IMPRESSION: No acute intracranial pathology.  Age-related atrophy and chronic microvascular ischemic disease.  If symptoms persist and there are no contraindications, MRI may provide better evaluation if clinically indicated.   Electronically Signed   By: Anner Crete M.D.   On: 07/26/2014 22:01   Dg Chest Port 1 View  07/26/2014   CLINICAL DATA:  Dementia, progressive weakness, confusion concern for urinary tract infection and pneumonia. Productive cough.  EXAM: PORTABLE CHEST - 1 VIEW  COMPARISON:  05/07/2012, 03/11/2008  FINDINGS: Marked rotation to the right. Low lung volumes. Chronic background interstitial changes with increased basilar scarring/ atelectasis. No definite superimposed pneumonia or edema. No effusion or pneumothorax. Aorta is atherosclerotic and tortuous. Degenerative changes of the spine. Chronic Paget's disease of the proximal left humerus as before. Degenerative changes of both shoulders.  IMPRESSION: Low volume rotated exam.  Chronic interstitial lung disease  and increased basilar atelectasis/ scarring.   Electronically Signed   By: Jerilynn Mages.  Shick M.D.   On: 07/26/2014 21:18     EKG Interpretation None      MDM   Final diagnoses:  Mental status change  Altered mental status    Patient presents to the ER for evaluation of cough, chest congestion and mild confusion. She does have a history of dementia, but family thought that she was more confused than usual. Her workup today has been unremarkable. Chest x-ray does not show evidence of pneumonia. Urinary tract infection is not present on UA. Culture will be sent. Blood work was unremarkable other than slightly elevated BUN. Patient likely has some mild dehydration. She was administered IV fluids here in the ER and is feeling better. Will be discharged with empiric treatment for cough including  Zithromax. Follow-up with PCP.   Orpah Greek, MD 07/26/14 [phone]

## 2014-07-28 ENCOUNTER — Emergency Department (HOSPITAL_COMMUNITY): Payer: Medicare Other

## 2014-07-28 ENCOUNTER — Inpatient Hospital Stay (HOSPITAL_COMMUNITY)
Admission: EM | Admit: 2014-07-28 | Discharge: 2014-08-01 | DRG: 092 | Disposition: A | Discharge status: Home Health | Payer: Medicare Other | Source: Intra-hospital | Attending: Internal Medicine | Admitting: Internal Medicine

## 2014-07-28 ENCOUNTER — Inpatient Hospital Stay (HOSPITAL_COMMUNITY): Payer: Medicare Other

## 2014-07-28 ENCOUNTER — Encounter (HOSPITAL_COMMUNITY): Payer: Self-pay | Admitting: *Deleted

## 2014-07-28 DIAGNOSIS — E876 Hypokalemia: Secondary | ICD-10-CM | POA: Diagnosis present

## 2014-07-28 DIAGNOSIS — F0391 Unspecified dementia with behavioral disturbance: Secondary | ICD-10-CM

## 2014-07-28 DIAGNOSIS — G92 Toxic encephalopathy: Principal | ICD-10-CM

## 2014-07-28 DIAGNOSIS — E860 Dehydration: Secondary | ICD-10-CM | POA: Diagnosis present

## 2014-07-28 DIAGNOSIS — E871 Hypo-osmolality and hyponatremia: Secondary | ICD-10-CM | POA: Diagnosis present

## 2014-07-28 DIAGNOSIS — T43595A Adverse effect of other antipsychotics and neuroleptics, initial encounter: Secondary | ICD-10-CM | POA: Diagnosis present

## 2014-07-28 DIAGNOSIS — T424X5A Adverse effect of benzodiazepines, initial encounter: Secondary | ICD-10-CM | POA: Diagnosis present

## 2014-07-28 DIAGNOSIS — R4182 Altered mental status, unspecified: Secondary | ICD-10-CM | POA: Diagnosis present

## 2014-07-28 DIAGNOSIS — Z85038 Personal history of other malignant neoplasm of large intestine: Secondary | ICD-10-CM

## 2014-07-28 DIAGNOSIS — F0390 Unspecified dementia without behavioral disturbance: Secondary | ICD-10-CM

## 2014-07-28 DIAGNOSIS — G459 Transient cerebral ischemic attack, unspecified: Secondary | ICD-10-CM | POA: Diagnosis not present

## 2014-07-28 DIAGNOSIS — I10 Essential (primary) hypertension: Secondary | ICD-10-CM | POA: Diagnosis present

## 2014-07-28 DIAGNOSIS — G9340 Encephalopathy, unspecified: Secondary | ICD-10-CM | POA: Diagnosis not present

## 2014-07-28 DIAGNOSIS — R29810 Facial weakness: Secondary | ICD-10-CM | POA: Diagnosis present

## 2014-07-28 DIAGNOSIS — M889 Osteitis deformans of unspecified bone: Secondary | ICD-10-CM

## 2014-07-28 DIAGNOSIS — Z66 Do not resuscitate: Secondary | ICD-10-CM | POA: Diagnosis present

## 2014-07-28 DIAGNOSIS — R079 Chest pain, unspecified: Secondary | ICD-10-CM | POA: Diagnosis present

## 2014-07-28 LAB — COMPREHENSIVE METABOLIC PANEL
ALBUMIN: 3.6 g/dL (ref 3.5–5.0)
ALT: 17 U/L (ref 14–54)
AST: 18 U/L (ref 15–41)
Alkaline Phosphatase: 151 U/L — ABNORMAL HIGH (ref 38–126)
Anion gap: 9 (ref 5–15)
BUN: 19 mg/dL (ref 6–20)
CALCIUM: 10.4 mg/dL — AB (ref 8.9–10.3)
CO2: 24 mmol/L (ref 22–32)
CREATININE: 0.76 mg/dL (ref 0.44–1.00)
Chloride: 106 mmol/L (ref 101–111)
GFR calc Af Amer: >60 mL/min (ref >60)
GFR calc non Af Amer: >60 mL/min (ref >60)
GLUCOSE: 110 mg/dL — AB (ref 65–99)
POTASSIUM: 3.7 mmol/L (ref 3.5–5.1)
Sodium: 139 mmol/L (ref 135–145)
Total Bilirubin: 0.8 mg/dL (ref 0.3–1.2)
Total Protein: 6.4 g/dL — ABNORMAL LOW (ref 6.5–8.1)

## 2014-07-28 LAB — DIFFERENTIAL
BASOS ABS: 0 10*3/uL (ref 0.0–0.1)
Basophils Relative: 0 % (ref 0–1)
EOS PCT: 4 % (ref 0–5)
Eosinophils Absolute: 0.2 10*3/uL (ref 0.0–0.7)
LYMPHS PCT: 20 % (ref 12–46)
Lymphs Abs: 1.1 10*3/uL (ref 0.7–4.0)
Monocytes Absolute: 0.4 10*3/uL (ref 0.1–1.0)
Monocytes Relative: 7 % (ref 3–12)
Neutro Abs: 3.7 10*3/uL (ref 1.7–7.7)
Neutrophils Relative %: 69 % (ref 43–77)

## 2014-07-28 LAB — CBC
HCT: 37.4 % (ref 36.0–46.0)
HCT: 36.8 % (ref 36.0–46.0)
HEMOGLOBIN: 13.1 g/dL (ref 12.0–15.0)
Hemoglobin: 13 g/dL (ref 12.0–15.0)
MCH: 32.3 pg (ref 26.0–34.0)
MCH: 32.3 pg (ref 26.0–34.0)
MCHC: 35 g/dL (ref 30.0–36.0)
MCHC: 35.3 g/dL (ref 30.0–36.0)
MCV: 92.1 fL (ref 78.0–100.0)
MCV: 91.3 fL (ref 78.0–100.0)
Platelets: 134 10*3/uL — ABNORMAL LOW (ref 150–400)
Platelets: 157 10*3/uL (ref 150–400)
RBC: 4.06 MIL/uL (ref 3.87–5.11)
RBC: 4.03 MIL/uL (ref 3.87–5.11)
RDW: 13 % (ref 11.5–15.5)
RDW: 13 % (ref 11.5–15.5)
WBC: 5.4 10*3/uL (ref 4.0–10.5)
WBC: 6 10*3/uL (ref 4.0–10.5)

## 2014-07-28 LAB — APTT: APTT: 38 s — AB (ref 24–37)

## 2014-07-28 LAB — CREATININE, SERUM
CREATININE: 0.63 mg/dL (ref 0.44–1.00)
GFR calc Af Amer: >60 mL/min (ref >60)
GFR calc non Af Amer: >60 mL/min (ref >60)

## 2014-07-28 LAB — PROTIME-INR
INR: 1.12 (ref 0.00–1.49)
Prothrombin Time: 14.6 seconds (ref 11.6–15.2)

## 2014-07-28 LAB — TSH: TSH: 1.779 u[IU]/mL (ref 0.350–4.500)

## 2014-07-28 LAB — ETHANOL: ALCOHOL ETHYL (B): 6 mg/dL — AB (ref <5)

## 2014-07-28 LAB — I-STAT TROPONIN, ED: Troponin i, poc: 0.01 ng/mL (ref 0.00–0.08)

## 2014-07-28 MED ORDER — HYDRALAZINE HCL 20 MG/ML IJ SOLN: 5.0000 mg | INTRAMUSCULAR | Status: DC | PRN

## 2014-07-28 MED ORDER — LORAZEPAM 2 MG/ML IJ SOLN
1.0000 mg | Freq: Once | INTRAMUSCULAR | Status: AC
  Administered 2014-07-28: 2 mg via INTRAVENOUS
  Filled 2014-07-28: qty 1

## 2014-07-28 MED ORDER — DULOXETINE HCL 20 MG PO CPEP
20.0000 mg | ORAL_CAPSULE | Freq: Two times a day (BID) | ORAL | Status: DC
  Administered 2014-07-29 – 2014-08-01 (×8): 20 mg via ORAL
  Filled 2014-07-28 (×10): qty 1

## 2014-07-28 MED ORDER — LATANOPROST 0.005 % OP SOLN
1.0000 [drp] | Freq: Every evening | OPHTHALMIC | Status: DC
  Administered 2014-07-29 – 2014-08-01 (×4): 1 [drp] via OPHTHALMIC
  Filled 2014-07-28: qty 2.5

## 2014-07-28 MED ORDER — STROKE: EARLY STAGES OF RECOVERY BOOK
Freq: Once | Status: AC
  Administered 2014-07-28: 17:00:00
  Filled 2014-07-28: qty 1

## 2014-07-28 MED ORDER — RISPERIDONE 0.5 MG PO TABS
0.5000 mg | ORAL_TABLET | Freq: Every day | ORAL | Status: DC
  Administered 2014-07-29: 0.5 mg via ORAL
  Filled 2014-07-28 (×3): qty 1

## 2014-07-28 MED ORDER — RISPERIDONE 0.25 MG PO TABS
0.2500 mg | ORAL_TABLET | Freq: Every day | ORAL | Status: DC
Start: 2014-07-29 — End: 2014-07-30
  Administered 2014-07-29: 0.25 mg via ORAL
  Filled 2014-07-28 (×2): qty 1

## 2014-07-28 MED ORDER — DONEPEZIL HCL 10 MG PO TABS
10.0000 mg | ORAL_TABLET | Freq: Every day | ORAL | Status: DC
  Administered 2014-07-29 – 2014-08-01 (×4): 10 mg via ORAL
  Filled 2014-07-28 (×4): qty 1

## 2014-07-28 MED ORDER — LORAZEPAM 2 MG/ML IJ SOLN
INTRAMUSCULAR | Status: AC
  Filled 2014-07-28: qty 1

## 2014-07-28 MED ORDER — HEPARIN SODIUM (PORCINE) 5000 UNIT/ML IJ SOLN
5000.0000 [IU] | Freq: Three times a day (TID) | INTRAMUSCULAR | Status: DC
  Administered 2014-07-29 – 2014-08-01 (×9): 5000 [IU] via SUBCUTANEOUS
  Filled 2014-07-28 (×14): qty 1

## 2014-07-28 MED ORDER — RISPERIDONE 0.5 MG PO TABS
0.2500 mg | ORAL_TABLET | Freq: Two times a day (BID) | ORAL | Status: DC
  Filled 2014-07-28: qty 1

## 2014-07-28 MED ORDER — MEMANTINE HCL ER 28 MG PO CP24
28.0000 mg | ORAL_CAPSULE | Freq: Every day | ORAL | Status: DC
  Administered 2014-07-29 – 2014-08-01 (×4): 28 mg via ORAL
  Filled 2014-07-28 (×4): qty 1

## 2014-07-28 NOTE — ED Notes (Signed)
Pt arrives from home via [facility]. Pt woke up around 0800 and was able to walk and talk and stated she was hungry and went to the bathroom. While in the bathroom the pt began having a blank stare, right sided facial droop and right sided weakness per nurse aid. Pt has a hx of dementia, htn and is currently being treated for a UTI. Caregiver contacted.

## 2014-07-28 NOTE — Progress Notes (Signed)
Utilization Review completed. Bowie Doiron RN BSN CM 

## 2014-07-28 NOTE — ED Notes (Signed)
Dr. Wilson Singer made aware of CT results

## 2014-07-28 NOTE — Progress Notes (Signed)
SLP Cancellation Note  Patient Details Name: SHAINDY READER MRN: [id number] DOB: 10-16-1922   Cancelled treatment:       Reason Eval/Treat Not Completed:  Attempted to see pt for swallow assessment, however transport arrived to take pt to MRI.  Will return next date.    Juan Quam Laurice 07/28/2014, 4:01 PM

## 2014-07-28 NOTE — H&P (Signed)
[facility] History and Physical  [name] HKV:[id number] DOB: 09-05-1922 DOA: 07/28/2014  Referring physician: ED1 PCP: Velna Hatchet, MD   Chief Complaint: Altered mental status  HPI: [name] is a 81 y.o. female with a history of dementia, HTN, constipation and colon cancer who has been experiencing increased confusion for the past 2 weeks that is worsening per home health. Over the past 2 weeks, patient has also been eating and drinking less, has not been feeding herself, and has been wearing depends due to incontinence. At baseline, she is is not incontinent, is able to feed herself and is able to converse verbally. This morning, she was sitting on the toilet when the home health aide noticed right arm and foot flapping that persisted until EMS arrived. The patient did not fall of the toilet and though she was awake, she was not verbally responsive. Patient has not had any recent falls nor does she have a history of seizures.  She is currently being treated for a UTI with levofloxacin. Per home health, her risperadone was decreased 4-5 days ago and since then she has been sleeping less. Her home nurse also reports that patient has been newly incontinent over the past 2 weeks and has needed to wear depends There has been no foul odor in the urine nor any other findings She has not had a cough  While in the ED, head CT was negative for a bleed and for an acute ischemic process. Labwork was unremarkable except for a decreased patelet count of 134. Neurology was consulted as patient came in initially as a code stroke.  her initial NIH SS was 13but was not given TPA secondary to unclear when last known normal and her modified Rankin was 4  Review of Systems: Provided by home health Constitutional: Denies fever, weight loss, night sweats Pulm: ++ chronic cough/congestion GI/GU: ++ constipation/dry stools. Denies vomiting, diarrhea, melena, hematochezia, urinary retention,  blood in urine the patient's history is severely limited secondary to some of her dementia although she is more alert and oriented and  then she has been when she was first evaluated by neurology    Past Medical History  Diagnosis Date  . Paget's bone disease   . Hypertension   . Cancer     colon  . Glaucoma   . Constipation   . Dementia    Past Surgical History  Procedure Laterality Date  . Hip arthroplasty     Social History:  reports that she has never smoked. She has never used smokeless tobacco. She reports that she does not drink alcohol or use illicit drugs. Patient lives at home with her husband who also has moderate dementia.  Allergies  Allergen Reactions  . Percocet [Oxycodone-Acetaminophen]     unknown    History reviewed. No pertinent family history.  Prior to Admission medications   Medication Sig Start Date End Date Taking? Authorizing Provider  amLODipine (NORVASC) 10 MG tablet Take 10 mg by mouth every morning. 05/11/12   Oswald Hillock, MD  Cholecalciferol (VITAMIN D) 2000 UNITS CAPS Take 2,000 Units by mouth daily.    Historical Provider, MD  docusate sodium (COLACE) 100 MG capsule Take 100 mg by mouth daily as needed for mild constipation.    Historical Provider, MD  donepezil (ARICEPT) 10 MG tablet Take 10 mg by mouth at bedtime.    Historical Provider, MD  DULoxetine (CYMBALTA) 20 MG capsule Take 20 mg by mouth 2 (two) times daily.  Historical Provider, MD  latanoprost (XALATAN) 0.005 % ophthalmic solution Place 1 drop into both eyes every evening.     Historical Provider, MD  levofloxacin (LEVAQUIN) 750 MG tablet Take 1 tablet (750 mg total) by mouth daily. 07/26/14   Orpah Greek, MD  LORazepam (ATIVAN) 0.5 MG tablet Take 0.5 mg by mouth 2 (two) times daily as needed for anxiety.    Historical Provider, MD  memantine (NAMENDA XR) 28 MG CP24 24 hr capsule Take 28 mg by mouth daily.    Historical Provider, MD  metoprolol (LOPRESSOR) 50 MG tablet  Take 50-100 mg by mouth 2 (two) times daily. 50 mg in the morning and 100 mg at night    Historical Provider, MD  Multiple Vitamin (MULTIVITAMIN WITH MINERALS) TABS Take 1 tablet by mouth every morning.     Historical Provider, MD  polyethylene glycol (MIRALAX / GLYCOLAX) packet Take 17 g by mouth daily as needed for moderate constipation.     Historical Provider, MD  Probiotic Product (ALIGN) 4 MG CAPS Take 1 capsule by mouth daily.    Historical Provider, MD  risedronate (ACTONEL) 35 MG tablet Take 35 mg by mouth every 7 (seven) days. On Tuesdays    Historical Provider, MD  risperiDONE (RISPERDAL) 0.5 MG tablet Take 0.25-0.5 mg by mouth 2 (two) times daily. Half in the morning and whole tab at night    Historical Provider, MD   Physical Exam: Filed Vitals:   07/28/14 1007 07/28/14 1014 07/28/14 1015 07/28/14 1019  BP:   177/73 177/73  Pulse:    67  Temp:    97.1 F (36.2 C)  TempSrc:    Axillary  Resp:   14 16  Weight:  54 kg (119 lb 0.8 oz)    SpO2: 97%   97%    Wt Readings from Last 3 Encounters:  07/28/14 54 kg (119 lb 0.8 oz)  07/26/14 49.442 kg (109 lb)  05/11/12 45.2 kg (99 lb 10.4 oz)    General:  Appears calm, laying supine and is responsive to verbal communication. Upon sitting forward, she groaned and stated she was in pain, but was unable to localize pain. patient moans and groans and cannot give specific answers to specific questions Eyes: PERRL, irises & conjunctiva clear, one small abrasion above each eyelid, on the top of the head she has 2-3 cm cystic like hard not ENT: grossly normal hearing, dry lips and mucus mucosa Neck: no LAD, masses or thyromegaly. Transverse scar on base of neck.  consistent with hyperthyroidism/thyroid surgery Cardiovascular: RRR, no m/r/g. No LE edema. JVD present. 1+ DP/PT pulses. Telemetry: SR with PACs, regularly irregular rhythm. Respiratory: CTA bilaterally, no w/r/r. Normal respiratory effort. Abdomen: Bowel sounds present, soft,  ntnd Skin: no rash or induration seen on limited exam. Diffuse dry skin.  Musculoskeletal: grossly normal tone BUE/BLE. 5/5 strength BUE/BLE.  Psychiatric: Decreased mental status from baseline. Limited verbal responsiveness. Neurologic: Facial droop on right side, CN exam attempted - patient non cooperative secondary to dementia. Sensation intact BUE/BLE.           Labs on Admission:  Basic Metabolic Panel:  Recent Labs Lab 07/26/14 2039  NA 137  K 4.0  CL 106  CO2 24  GLUCOSE 108*  BUN 26*  CREATININE 0.69  CALCIUM 10.2   Liver Function Tests:  Recent Labs Lab 07/26/14 2039  AST 19  ALT 17  ALKPHOS 164*  BILITOT 0.8  PROT 6.9  ALBUMIN 3.8   No  results for input(s): LIPASE, AMYLASE in the last 168 hours. No results for input(s): AMMONIA in the last 168 hours. CBC:  Recent Labs Lab 07/26/14 2039 07/28/14 0957  WBC 8.1 5.4  NEUTROABS  --  3.7  HGB 13.2 13.1  HCT 38.4 37.4  MCV 93.0 92.1  PLT 156 134*   Cardiac Enzymes: No results for input(s): CKTOTAL, CKMB, CKMBINDEX, TROPONINI in the last 168 hours.  BNP (last 3 results) No results for input(s): BNP in the last 8760 hours.  ProBNP (last 3 results) No results for input(s): PROBNP in the last 8760 hours.  CBG: No results for input(s): GLUCAP in the last 168 hours.  Radiological Exams on Admission: Ct Head Wo Contrast  07/26/2014   CLINICAL DATA:  81 year old female with dementia and confusion  EXAM: CT HEAD WITHOUT CONTRAST  TECHNIQUE: Contiguous axial images were obtained from the base of the skull through the vertex without intravenous contrast.  COMPARISON:  CT dated 05/22/2013  FINDINGS: The ventricles are dilated and the sulci are prominent compatible with age-related atrophy. Periventricular and deep white matter hypodensities represent chronic microvascular ischemic changes. There is no intracranial hemorrhage. No mass effect or midline shift identified.  The visualized paranasal sinuses and  mastoid air cells are well aerated. The calvarium is intact. Stable left temporal calvarial focal lucency is incompletely characterized. MRI may provide better evaluation if clinically indicated.  IMPRESSION: No acute intracranial pathology.  Age-related atrophy and chronic microvascular ischemic disease.  If symptoms persist and there are no contraindications, MRI may provide better evaluation if clinically indicated.   Electronically Signed   By: Anner Crete M.D.   On: 07/26/2014 22:01   Dg Chest Port 1 View  07/26/2014   CLINICAL DATA:  Dementia, progressive weakness, confusion concern for urinary tract infection and pneumonia. Productive cough.  EXAM: PORTABLE CHEST - 1 VIEW  COMPARISON:  05/07/2012, 03/11/2008  FINDINGS: Marked rotation to the right. Low lung volumes. Chronic background interstitial changes with increased basilar scarring/ atelectasis. No definite superimposed pneumonia or edema. No effusion or pneumothorax. Aorta is atherosclerotic and tortuous. Degenerative changes of the spine. Chronic Paget's disease of the proximal left humerus as before. Degenerative changes of both shoulders.  IMPRESSION: Low volume rotated exam.  Chronic interstitial lung disease and increased basilar atelectasis/ scarring.   Electronically Signed   By: Jerilynn Mages.  Shick M.D.   On: 07/26/2014 21:18    EKG: Independently reviewed. sinus rhythm with PACs/PVCs regularly irregularno evidence for ischemia  Assessment/Plan Principal Problem:   TIA (transient ischemic attack)/toxic metabolic encephalopathy -unclear etiology as to what happened -Caregiver at the bedside giving secondhand information as primary caregivers person that noted her sitting on the toilet shaking of the right side -No specific loss of consciousness reported and no new focal deficit but there is some twisting of the mouth focal deficit but there is some twisting of the mouth -Get usual workup for stroke including MRI, echo, carotids and lab  work including lipids and A1c although at age 63 unlikely we will aggressively control these other than for stroke measures -Consider EEG? Defer to neurology -she failed a swallow study so she will need all IV medications -she has a history of colon cancer in 2009 which is in remission however the concern presently is that she have a metastatic process occurring in her brain? Paget's bone disease typically will not cause confusion -I noticed that she also has a scar in the lower neck which is consistent with maybe a thyroidectomy  yet she is not on any replacement with Synthroid therefore we will obtain a TSH as well to ensure that confusion is not related to either hyper or hypothyroid state  Active Problems:   HTN (hypertension) -would not aggressively control blood pressure for right now as she is on Lopressor 50 twice a daywhich she can resume once speech therapy sees her  I will discontinue amlodipine for now.   Paget's bone disease -previously treated and not currently under treatment -defer to PCP   Dementiamoderate to severe -Her MMSE is at least 20-23 and she is on Aricept and presumed Namenda 28 daily as well as Cymbalta 20 twice a day she is also on Risperdal 0.5 twice a dayby Dr. Melinda Crutch  Code Status: DO NOT RESUSCITATE confirmed at the bedside  DVT Prophylaxis: Family Communication: discussed with caregiver at the bedside and has not been able to contact the family is at Disposition Plan: inpatient rule out stroke versus metabolic encephalopathymaybe 2-3 days   Time spent: 72   Verneita Griffes, MD [place] [phone]

## 2014-07-28 NOTE — Code Documentation (Signed)
81yo female arriving to [facility] via [place] at [phone].  EMS reports that patient was at home with caregiver when she found on the toilet to be staring off.  EMS called and activated Code Stroke for right facial droop and right hand weakness.  Stroke team at the bedside on arrival.  Labs drawn and patient cleared by Dr. Aline Brochure.  Patient to CT.  Dr. Doy Mince to the bedside.  NIHSS 13, see documentation for details and code stroke times.  Stroke RN called the patient's caregiver who was at home with patient's husband who also has dementia.  Patient with h/o dementia, HTN and recent infection.  Patient started antibiotics yesterday.  Per caregiver patient is usually able to ambulate and communicate, however, she is only oriented intermittently.  Today the caregiver arrived to the patient's home at 0800 and patient was sleeping.  Caregiver woke her up at 0850 and assisted her to sit up on the side of the bed.  There she was able to answer yes/no questions.  She was given medication and then went to the bathroom.  Upon return to the bathroom her caregiver found her to be starring off.  She then noted right arm shaking and right foot tapping and called her office her instructed her to call 911.  Caregiver did not describe any focal weakness but did say that the patient has been weaker lately.  Dr. Doy Mince made aware of this information and given the patient's daughter's contact information to update on plan of care.  No acute stroke treatment at this time per MD.  Bedside handoff with ED RN Elmyra Ricks.

## 2014-07-28 NOTE — ED Notes (Signed)
Attempted report 

## 2014-07-28 NOTE — Consult Note (Signed)
Referring Physician: Tawnya Crook    Chief Complaint: Stroke  HPI:                                                                                                                                         [name] is an 81 y.o. female patient awoke this morning and was acting her normal self.  Normally she is able to ambulate unassisted and hold a conversation.  She does have known dementia and was recently treated for UTI. At 0900 hours she was on the toilet and noted to have right facial droop and right hand decreased sensation. Patient was last seen by her home health care nurse. There was concern for stroke and patient was brought to ED as code stroke.  Date last known well: Date: 07/28/2014 Time last known well: Unable to determine tPA Given: No: unclear LNW Modified Rankin: Rankin Score=4  [facility]--[phone]  Past Medical History  Diagnosis Date  . Paget's bone disease   . Hypertension   . Cancer     colon  . Glaucoma   . Constipation   . Dementia     Past Surgical History  Procedure Laterality Date  . Hip arthroplasty      Family History  Problem Relation Age of Onset  . Hypertension Mother   . Hypertension Father    Social History:  reports that she has never smoked. She has never used smokeless tobacco. She reports that she does not drink alcohol or use illicit drugs.  Allergies:  Allergies  Allergen Reactions  . Percocet [Oxycodone-Acetaminophen]     unknown    Medications:                                                                                                                           No current facility-administered medications for this encounter.   Current Outpatient Prescriptions  Medication Sig Dispense Refill  . amLODipine (NORVASC) 10 MG tablet Take 10 mg by mouth every morning.    . Cholecalciferol (VITAMIN D) 2000 UNITS CAPS Take 2,000 Units by mouth daily.    Marland Kitchen docusate sodium (COLACE) 100 MG capsule Take 100 mg by mouth daily  as needed for mild constipation.    Marland Kitchen donepezil (ARICEPT) 10 MG tablet Take 10 mg by mouth at bedtime.    . DULoxetine (CYMBALTA) 20 MG capsule Take 20  mg by mouth 2 (two) times daily.    Marland Kitchen latanoprost (XALATAN) 0.005 % ophthalmic solution Place 1 drop into both eyes every evening.     Marland Kitchen levofloxacin (LEVAQUIN) 750 MG tablet Take 1 tablet (750 mg total) by mouth daily. 5 tablet 0  . LORazepam (ATIVAN) 0.5 MG tablet Take 0.5 mg by mouth 2 (two) times daily as needed for anxiety.    . memantine (NAMENDA XR) 28 MG CP24 24 hr capsule Take 28 mg by mouth daily.    . metoprolol (LOPRESSOR) 50 MG tablet Take 50-100 mg by mouth 2 (two) times daily. 50 mg in the morning and 100 mg at night    . Multiple Vitamin (MULTIVITAMIN WITH MINERALS) TABS Take 1 tablet by mouth every morning.     . polyethylene glycol (MIRALAX / GLYCOLAX) packet Take 17 g by mouth daily as needed for moderate constipation.     . Probiotic Product (ALIGN) 4 MG CAPS Take 1 capsule by mouth daily.    . risedronate (ACTONEL) 35 MG tablet Take 35 mg by mouth every 7 (seven) days. On Tuesdays    . risperiDONE (RISPERDAL) 0.5 MG tablet Take 0.25-0.5 mg by mouth 2 (two) times daily. Half in the morning and whole tab at night       ROS:                                                                                                                                       History obtained from unobtainable from patient due to mental status  Neurologic Examination:                                                                                                      Blood pressure 171/78, pulse 68, temperature 97.1 F (36.2 C), temperature source Axillary, resp. rate 16, weight 54 kg (119 lb 0.8 oz), SpO2 98 %.  HEENT-  Normocephalic, no lesions, without obvious abnormality.  Normal external eye and conjunctiva.  Normal TM's bilaterally.  Normal auditory canals and external ears. Normal external nose, mucus membranes and septum.  Normal  pharynx. Cardiovascular- S1, S2 normal, pulses palpable throughout   Lungs- chest clear, no wheezing, rales, normal symmetric air entry Abdomen- normal findings: bowel sounds normal Extremities- no edema Lymph-no adenopathy palpable Musculoskeletal-no joint tenderness, deformity or swelling Skin-warm and dry, no hyperpigmentation, vitiligo, or suspicious lesions  Neurological Examination Mental Status: Alert, moaning when extremities moved, not answering questions but is able to follow commands  such as touch her finger to her nose, tract my finger when asked and hold her arms up if if asked.   Cranial Nerves: II: Discs flat bilaterally; blinks to threat bilaaterally, pupils equal, round, reactive to light and accommodation III,IV, VI: ptosis not present, extra-ocular motions intact bilaterally V,VII: smile asymmetric on the right, winces to pin prick bilaterally VIII: looks to voice IX,X: uvula rises symmetrically XI: bilateral shoulder shrug XII: midline tongue Motor: Moving all extremities antigravity.  Moving her right leg greater than her left leg.  Sensory: withdraws from pain briskly in all extremities Deep Tendon Reflexes: 2+ and symmetric throughout UE and KJ no AJ bilaterally Plantars: Right: downgoing   Left: downgoing Cerebellar: normal finger-to-nose, unable to obtain Heel to shin Gait: not able to obtain due to cognition    Lab Results: Basic Metabolic Panel:  Recent Labs Lab 07/26/14 2039 07/28/14 0957  NA 137 139  K 4.0 3.7  CL 106 106  CO2 24 24  GLUCOSE 108* 110*  BUN 26* 19  CREATININE 0.69 0.76  CALCIUM 10.2 10.4*    Liver Function Tests:  Recent Labs Lab 07/26/14 2039 07/28/14 0957  AST 19 18  ALT 17 17  ALKPHOS 164* 151*  BILITOT 0.8 0.8  PROT 6.9 6.4*  ALBUMIN 3.8 3.6   No results for input(s): LIPASE, AMYLASE in the last 168 hours. No results for input(s): AMMONIA in the last 168 hours.  CBC:  Recent Labs Lab 07/26/14 2039  07/28/14 0957  WBC 8.1 5.4  NEUTROABS  --  3.7  HGB 13.2 13.1  HCT 38.4 37.4  MCV 93.0 92.1  PLT 156 134*    Cardiac Enzymes: No results for input(s): CKTOTAL, CKMB, CKMBINDEX, TROPONINI in the last 168 hours.  Lipid Panel: No results for input(s): CHOL, TRIG, HDL, CHOLHDL, VLDL, LDLCALC in the last 168 hours.  CBG: No results for input(s): GLUCAP in the last 168 hours.  Microbiology: Results for orders placed or performed during the hospital encounter of 05/07/12  MRSA PCR Screening     Status: None   Collection Time: 05/08/12  6:40 AM  Result Value Ref Range Status   MRSA by PCR NEGATIVE NEGATIVE Final    Comment:        The GeneXpert MRSA Assay (FDA approved for NASAL specimens only), is one component of a comprehensive MRSA colonization surveillance program. It is not intended to diagnose MRSA infection nor to guide or monitor treatment for MRSA infections.    Coagulation Studies:  Recent Labs  07/28/14 0957  LABPROT 14.6  INR 1.12    Imaging: Ct Head Wo Contrast  07/28/2014   CLINICAL DATA:  Right-sided facial droop.  EXAM: CT HEAD WITHOUT CONTRAST  TECHNIQUE: Contiguous axial images were obtained from the base of the skull through the vertex without intravenous contrast.  COMPARISON:  CT scan of July 26, 2014.  FINDINGS: Bony calvarium appears intact. Moderate diffuse cortical atrophy is noted. Mild chronic ischemic white matter disease is noted. No mass effect or midline shift is noted. Ventricular size is within normal limits. There is no evidence of mass lesion or hemorrhage. Ill-defined low density is noted in the left side of the pons which is consistent with infarction of indeterminate age. It cannot be determined with certainty if it was or was not present on prior exam.  IMPRESSION: Moderate diffuse cortical atrophy. Mild chronic ischemic white matter disease. Ill-defined low density seen in the left side of the pons consistent with infarction of  indeterminate age. MRI may be performed for further evaluation. No hemorrhage is noted. Attempts to contact Dr. Doy Mince or the [facility] emergent department were unsuccessful. These results will be called to the ordering clinician or representative by the Radiologist Assistant, and communication documented in the PACS or zVision Dashboard.   Electronically Signed   By: Marijo Conception, M.D.   On: 07/28/2014 10:27   Ct Head Wo Contrast  07/26/2014   CLINICAL DATA:  81 year old female with dementia and confusion  EXAM: CT HEAD WITHOUT CONTRAST  TECHNIQUE: Contiguous axial images were obtained from the base of the skull through the vertex without intravenous contrast.  COMPARISON:  CT dated 05/22/2013  FINDINGS: The ventricles are dilated and the sulci are prominent compatible with age-related atrophy. Periventricular and deep white matter hypodensities represent chronic microvascular ischemic changes. There is no intracranial hemorrhage. No mass effect or midline shift identified.  The visualized paranasal sinuses and mastoid air cells are well aerated. The calvarium is intact. Stable left temporal calvarial focal lucency is incompletely characterized. MRI may provide better evaluation if clinically indicated.  IMPRESSION: No acute intracranial pathology.  Age-related atrophy and chronic microvascular ischemic disease.  If symptoms persist and there are no contraindications, MRI may provide better evaluation if clinically indicated.   Electronically Signed   By: Anner Crete M.D.   On: 07/26/2014 22:01   Dg Chest Port 1 View  07/26/2014   CLINICAL DATA:  Dementia, progressive weakness, confusion concern for urinary tract infection and pneumonia. Productive cough.  EXAM: PORTABLE CHEST - 1 VIEW  COMPARISON:  05/07/2012, 03/11/2008  FINDINGS: Marked rotation to the right. Low lung volumes. Chronic background interstitial changes with increased basilar scarring/ atelectasis. No definite superimposed pneumonia  or edema. No effusion or pneumothorax. Aorta is atherosclerotic and tortuous. Degenerative changes of the spine. Chronic Paget's disease of the proximal left humerus as before. Degenerative changes of both shoulders.  IMPRESSION: Low volume rotated exam.  Chronic interstitial lung disease and increased basilar atelectasis/ scarring.   Electronically Signed   By: Jerilynn Mages.  Shick M.D.   On: 07/26/2014 21:18    Etta Quill PA-C [facility] [phone]  07/28/2014, 10:37 AM   Patient seen and examined.  Clinical course and management discussed.  Necessary edits performed.  I agree with the above.  Assessment and plan of care developed and discussed below.   Assessment: 81 y.o. female presenting with no speech and focal weakness.  Based on daughter's report patient is not very functional at baseline.  At times does not speak at all and due to her dementia unclear if she recognizes her at times.  Onset of symptoms unclear as well.  Patient may have awakened with symptoms.  Due to this information it was decided on consultation with the daughter that tPA would not be administered.  Patient on no antiplatelet therapy at home.  Further work up recommended.    Stroke Risk Factors - hypertension  Recommendations: 1. HgbA1c, fasting lipid panel 2. MRI, MRA  of the brain without contrast 3. PT consult, OT consult, Speech consult 4. Echocardiogram 5. Carotid dopplers 6. Prophylactic therapy-Antiplatelet med: Aspirin - dose 300mg  rectally daily 7. NPO until RN stroke swallow screen 8. Telemetry monitoring 9. Frequent neuro checks 10. EEG     Alexis Goodell, MD [facility] [phone]  07/28/2014  12:00 PM

## 2014-07-29 ENCOUNTER — Inpatient Hospital Stay (HOSPITAL_COMMUNITY): Payer: Medicare Other

## 2014-07-29 DIAGNOSIS — F0390 Unspecified dementia without behavioral disturbance: Secondary | ICD-10-CM

## 2014-07-29 DIAGNOSIS — G9340 Encephalopathy, unspecified: Secondary | ICD-10-CM

## 2014-07-29 LAB — GLUCOSE, CAPILLARY
GLUCOSE-CAPILLARY: 97 mg/dL (ref 65–99)
GLUCOSE-CAPILLARY: 103 mg/dL — AB (ref 65–99)
GLUCOSE-CAPILLARY: 119 mg/dL — AB (ref 65–99)
Glucose-Capillary: 107 mg/dL — ABNORMAL HIGH (ref 65–99)
Glucose-Capillary: 115 mg/dL — ABNORMAL HIGH (ref 65–99)

## 2014-07-29 LAB — LIPID PANEL
Cholesterol: 183 mg/dL (ref 0–200)
HDL: 47 mg/dL (ref >40)
LDL Cholesterol: 125 mg/dL — ABNORMAL HIGH (ref 0–99)
TRIGLYCERIDES: 53 mg/dL (ref <150)
Total CHOL/HDL Ratio: 3.9 RATIO
VLDL: 11 mg/dL (ref 0–40)

## 2014-07-29 LAB — VITAMIN B12: VITAMIN B 12: 533 pg/mL (ref 180–914)

## 2014-07-29 MED ORDER — STARCH (THICKENING) PO POWD: ORAL | Status: DC | PRN

## 2014-07-29 MED ORDER — STARCH (THICKENING) PO POWD
ORAL | Status: DC | PRN
  Filled 2014-07-29: qty 227

## 2014-07-29 NOTE — Progress Notes (Signed)
Schorr MD notified about changes in pt's NIH of 19.

## 2014-07-29 NOTE — Evaluation (Signed)
Physical Therapy Evaluation Patient Details Name: [name] MRN: [id number] DOB: [birth year] Today's Date: 07/29/2014   History of Present Illness  [name] is a 81 y.o. female with a history of dementia, HTN, constipation and colon cancer who has been experiencing increased confusion for the past 2 weeks that is worsening per home health. Over the past 2 weeks, patient has also been eating and drinking less, has not been feeding herself, and has been wearing depends due to incontinence. At baseline, she is is not incontinent, is able to feed herself and is able to converse verbally. This morning, she was sitting on the toilet when the home health aide noticed right arm and foot flapping that persisted until EMS arrived. The patient did not fall of the toilet and though she was awake, she was not verbally responsive. Patient has not had any recent falls nor does she have a history of seizures. She is currently being treated for a UTI with levofloxacin. Per home health, her risperadone was decreased 4-5 days ago and since then she has been sleeping less.  Clinical Impression  Pt presents with decreased mobility, decreased level of arousal, decreased strength and decreased balance.  Requires up to max A for mobility at this time.  Caregiver present and states that they are not allowed to provide this much assist, therefore pt will need SNF level of care at D/C.  Caregiver verbalized understanding.  Continued acute services to address deficits.      Follow Up Recommendations SNF;Supervision/Assistance - 24 hour    Equipment Recommendations  None recommended by PT    Recommendations for Other Services       Precautions / Restrictions Precautions Precautions: Fall Precaution Comments: L sided weakness, R lateral lean in sitting Restrictions Weight Bearing Restrictions: No      Mobility  Bed Mobility Overal bed mobility: Needs Assistance Bed Mobility: Supine to Sit     Supine  to sit: Max assist     General bed mobility comments: Pt able to initiate and bring LEs to EOB.  Does require assist to fully bring them off EOB as well as assist to elevate trunk.  Note heavy R lateral lean in sitting, improved with prolonged sitting.   Transfers Overall transfer level: Needs assistance Equipment used: Rolling walker (2 wheeled);None Transfers: Sit to/from Stand Sit to Stand: Mod assist;Max assist         General transfer comment: Pt initially max A to partially stand from bed x 2 reps while caregiver assisted with peri care due to urinary incontinence.  She was unable to fully stand with increased posterior resistance noted.  during third stand, pt able to stand with mod fading to min A once standing with cues for increased hip extension.  heavy faciliation initially at hips for extension.  then provided pt with RW and stood at mod A with cues to "bring belly forward"  and she was then better able to correct.    Ambulation/Gait Ambulation/Gait assistance: Mod assist Ambulation Distance (Feet): 15 Feet (x2 reps) Assistive device: Rolling walker (2 wheeled) Gait Pattern/deviations: Step-to pattern;Decreased step length - left;Decreased stance time - left;Leaning posteriorly;Trunk flexed;Narrow base of support;Shuffle     General Gait Details: Pt with very short shuffled gait pattern and need of mod A to steady and also assist to guide RW.  Pt needs increased assist when backing up as she is anxious of falling, however tends to lean posteriorly.   Stairs  Wheelchair Mobility    Modified Rankin (Stroke Patients Only)       Balance Overall balance assessment: Needs assistance Sitting-balance support: Feet supported;Bilateral upper extremity supported Sitting balance-Leahy Scale: Poor Sitting balance - Comments: Pt requires up to mod A for sitting balance Postural control: Posterior lean;Right lateral lean Standing balance support: During functional  activity;Bilateral upper extremity supported Standing balance-Leahy Scale: Zero Standing balance comment: Requires up to max A at times for standing, esp from toilet due to posterior lean.                              Pertinent Vitals/Pain Pain Assessment: Faces Faces Pain Scale: Hurts even more Pain Location: L LE during gait Pain Descriptors / Indicators: Aching Pain Intervention(s): Monitored during session;Repositioned    Home Living Family/patient expects to be discharged to:: Skilled nursing facility Living Arrangements: Spouse/significant other Available Help at Discharge: Personal care attendant;Available 24 hours/day Type of Home: House       Home Layout: One level Home Equipment: [place] - 2 wheels;Shower seat      Prior Function Level of Independence: Needs assistance   Gait / Transfers Assistance Needed: Pt was ambulatory with RW PTA, caregiver states she did not have to assist, but has been more 2 weeks prior to admission.   ADL's / Homemaking Assistance Needed: Caregiver assists with bed mobility, bathing and dressing        Hand Dominance        Extremity/Trunk Assessment               Lower Extremity Assessment: Generalized weakness;LLE deficits/detail   LLE Deficits / Details: LLE seems to be overall weaker during gait and needs max cues to fully advance LLE  Cervical / Trunk Assessment: Kyphotic;Other exceptions  Communication   Communication: HOH  Cognition Arousal/Alertness: Lethargic Behavior During Therapy: Flat affect Overall Cognitive Status: Difficult to assess (note history of dementia)                      General Comments      Exercises        Assessment/Plan    PT Assessment Patient needs continued PT services  PT Diagnosis Difficulty walking;Generalized weakness;Acute pain;Abnormality of gait   PT Problem List Decreased strength;Decreased range of motion;Decreased activity tolerance;Decreased  balance;Decreased mobility;Decreased cognition;Decreased knowledge of use of DME;Decreased safety awareness;Decreased knowledge of precautions;Cardiopulmonary status limiting activity;Decreased skin integrity;Pain  PT Treatment Interventions DME instruction;Gait training;Functional mobility training;Therapeutic activities;Therapeutic exercise;Balance training;Neuromuscular re-education;Patient/family education   PT Goals (Current goals can be found in the Care Plan section) Acute Rehab PT Goals Patient Stated Goal: none stated PT Goal Formulation: With patient/family Time For Goal Achievement: 08/12/14 Potential to Achieve Goals: Fair    Frequency Min 3X/week   Barriers to discharge Decreased caregiver support      Co-evaluation               End of Session Equipment Utilized During Treatment: Gait belt Activity Tolerance: Patient limited by lethargy Patient left: in chair;with call bell/phone within reach;with family/visitor present Nurse Communication: Mobility status         Time: 4650-3546 PT Time Calculation (min) (ACUTE ONLY): 34 min   Charges:   PT Evaluation $Initial PT Evaluation Tier I: 1 Procedure PT Treatments $Therapeutic Activity: 8-22 mins   PT G Codes:        Denice Bors 07/29/2014, 5:08 PM

## 2014-07-29 NOTE — Evaluation (Signed)
Clinical/Bedside Swallow Evaluation Patient Details  Name: [name] MRN: [id number] Date of Birth: November 09, 1922  Today's Date: 07/29/2014 Time: SLP Start Time (ACUTE ONLY): 1310 SLP Stop Time (ACUTE ONLY): 1335 SLP Time Calculation (min) (ACUTE ONLY): 25 min  Past Medical History:  Past Medical History  Diagnosis Date  . Paget's bone disease   . Hypertension   . Cancer     colon  . Glaucoma   . Constipation   . Dementia    Past Surgical History:  Past Surgical History  Procedure Laterality Date  . Hip arthroplasty     HPI:  81 y.o. female with a history of dementia, HTN, constipation and colon cancer who has been experiencing increased confusion for the past 2 weeks that is worsening per home health. Over the past 2 weeks, patient has also been eating and drinking less, has not been feeding herself, and has been wearing depends due to incontinence.  Dx TIA vs metabolic encephalopathy; has mod-severe dementia at baseline; failed RN stroke swallow screen.    Assessment / Plan / Recommendation Clinical Impression   Pt exhibited frank s/s of aspiration including immediate cough with ice chips and oral holding/poor awareness of bolus and suspected delayed swallow with all consistencies with probable etiology related to cognition, but caregivers stated her "coughing" has started over the last week, but has been during meals.  Pt refused most of consistencies with exception of one bite of puree and one sip of thin liquids; OME unable to be completed d/t refusal and/or cognition; ST to f/u for diet tolerance; Dysphagia 1 and nectar-thickened liquids recommended d/t pt's risk of aspiration with thin d/t cognitive status and decreased awareness    Aspiration Risk  Mild    Diet Recommendation Dysphagia 1 (Puree);Nectar   Medication Administration: Crushed with puree Compensations: Slow rate;Small sips/bites    Other  Recommendations Oral Care Recommendations: Oral care BID   Follow  Up Recommendations    n/a   Frequency and Duration min 2x/week  1 week   Pertinent Vitals/Pain WDL    SLP Swallow Goals  See POC   Swallow Study Prior Functional Status   Dependent at home with 24 hr supervision; Alzheimer's dementia    General Date of Onset: 07/28/14 Other Pertinent Information: 81 y.o. female with a history of dementia, HTN, constipation and colon cancer who has been experiencing increased confusion for the past 2 weeks that is worsening per home health. Over the past 2 weeks, patient has also been eating and drinking less, has not been feeding herself, and has been wearing depends due to incontinence.  Dx TIA vs metabolic encephalopathy; has mod-severe dementia at baseline; failed RN stroke swallow screen.  Type of Study: Bedside swallow evaluation Previous Swallow Assessment: none recorded Diet Prior to this Study: NPO Temperature Spikes Noted: No Respiratory Status: Room air History of Recent Intubation: No Behavior/Cognition: Confused;Lethargic/Drowsy Oral Cavity - Dentition: Adequate natural dentition/normal for age Self-Feeding Abilities: Needs assist;Needs set up Patient Positioning: Upright in bed Baseline Vocal Quality: Low vocal intensity Volitional Cough: Cognitively unable to elicit Volitional Swallow: Unable to elicit    Oral/Motor/Sensory Function Overall Oral Motor/Sensory Function: Other (comment) Labial ROM:  (Unable to fully assess d/t cognition)   Ice Chips Ice chips: Impaired Presentation: Spoon Oral Phase Impairments: Poor awareness of bolus Oral Phase Functional Implications: Oral holding Pharyngeal Phase Impairments: Suspected delayed Swallow;Cough - Immediate   Thin Liquid Thin Liquid: Impaired Presentation: Cup Oral Phase Impairments: Poor awareness of bolus Oral Phase  Functional Implications: Oral holding Pharyngeal  Phase Impairments: Suspected delayed Swallow    Nectar Thick Nectar Thick Liquid: Not tested   Honey Thick  Honey Thick Liquid: Not tested   Puree Puree: Impaired Presentation: Spoon Oral Phase Impairments: Poor awareness of bolus Oral Phase Functional Implications: Prolonged oral transit;Oral holding Pharyngeal Phase Impairments: Suspected delayed Swallow   Solid       Solid: Not tested Other Comments: Pt refused       Surya Folden,PAT, M.S., CCC-SLP 07/29/2014,2:54 PM

## 2014-07-29 NOTE — Procedures (Signed)
ELECTROENCEPHALOGRAM REPORT  Date of Study: 07/29/2014  Patient's Name: [name] MRN: [id number] Date of Birth: February 07, 1922  Referring Provider: Dr. Alexis Goodell  Clinical History: This is an 81 year old woman with right facial droop and right hand decreased sensation, non-verbal.   Medications: donepezil (ARICEPT) tablet 10 mg DULoxetine (CYMBALTA) DR capsule 20 mg heparin injection 5,000 Units memantine (NAMENDA XR) 24 hr capsule 28 mg risperiDONE (RISPERDAL) tablet 0.5 mg  Technical Summary: A multichannel digital EEG recording measured by the international 10-20 system with electrodes applied with paste and impedances below 5000 ohms performed as portable with EKG monitoring in an awake and asleep patient.  Hyperventilation and photic stimulation were not performed.  The digital EEG was referentially recorded, reformatted, and digitally filtered in a variety of bipolar and referential montages for optimal display.   Description: The patient is awake and asleep during the recording.  During maximal wakefulness, there is a symmetric, medium voltage 8 Hz posterior dominant rhythm that poorlyattenuates to eye opening and eye closure. This is admixed with a moderate amount of diffuse 4-5 Hz theta and 2-3 Hz delta slowing of the waking background.  During drowsiness and sleep, there is an increase in theta and delta slowing of the background with poorly formed vertex waves seen.  Hyperventilation and photic stimulation were not performed.  There were no epileptiform discharges or electrographic seizures seen.    EKG lead showed irregular rhythm.  Impression: This awake and asleep EEG is abnormal due to moderate diffuse slowing of the waking background.  Clinical Correlation of the above findings indicates diffuse cerebral dysfunction that is non-specific in etiology and can be seen with hypoxic/ischemic injury, toxic/metabolic encephalopathies, neurodegenerative disorders, or  medication effect.  The absence of epileptiform discharges does not rule out a clinical diagnosis of epilepsy.  Clinical correlation is advised.   Ellouise Newer, M.D.

## 2014-07-29 NOTE — Progress Notes (Addendum)
Subjective: Patient in bed with eyes closed.  Responds little to questioning.   Objective: Current vital signs: BP 151/88 mmHg  Pulse 92  Temp(Src) 98 F (36.7 C) (Oral)  Resp 20  Wt 47 kg (103 lb 9.9 oz)  SpO2 95% Vital signs in last 24 hours: Temp:  [97.4 F (36.3 C)-98.2 F (36.8 C)] 98 F (36.7 C) (07/15 0527) Pulse Rate:  [52-92] 92 (07/15 0527) Resp:  [15-24] 20 (07/15 0527) BP: (118-167)/(58-94) 151/88 mmHg (07/15 0527) SpO2:  [93 %-97 %] 95 % (07/15 0527) Weight:  [47 kg (103 lb 9.9 oz)] 47 kg (103 lb 9.9 oz) (07/14 1451)  Intake/Output from previous day: 07/14 0701 - 07/15 0700 In: 0  Out: 2 [Urine:2] Intake/Output this shift: Total I/O In: 0  Out: 1 [Urine:1] Nutritional status: Diet NPO time specified  Neurologic Exam: Mental Status: Lethargic.  Only cries out when in pain.  No speech otherwise.  Does not follow commands Cranial Nerves: II: Discs flat bilaterally; Pupils equal, round, reactive to light and accommodation III,IV, VI: keeps eyes closed, oculocephalic maneuvers intact V,VII: mild right facial droop VIII: hearing normal bilaterally IX,X: gag reflex present XI: bilateral shoulder shrug XII: midline tongue extension Motor/Sensory: Withdraws to noxious stimuli in all extremities with increased tone noted in the LUE   Lab Results: Basic Metabolic Panel:  Recent Labs Lab 07/26/14 2039 07/28/14 0957 07/28/14 1545  NA 137 139  --   K 4.0 3.7  --   CL 106 106  --   CO2 24 24  --   GLUCOSE 108* 110*  --   BUN 26* 19  --   CREATININE 0.69 0.76 0.63  CALCIUM 10.2 10.4*  --     Liver Function Tests:  Recent Labs Lab 07/26/14 2039 07/28/14 0957  AST 19 18  ALT 17 17  ALKPHOS 164* 151*  BILITOT 0.8 0.8  PROT 6.9 6.4*  ALBUMIN 3.8 3.6   No results for input(s): LIPASE, AMYLASE in the last 168 hours. No results for input(s): AMMONIA in the last 168 hours.  CBC:  Recent Labs Lab 07/26/14 2039 07/28/14 0957 07/28/14 1545   WBC 8.1 5.4 6.0  NEUTROABS  --  3.7  --   HGB 13.2 13.1 13.0  HCT 38.4 37.4 36.8  MCV 93.0 92.1 91.3  PLT 156 134* 157    Cardiac Enzymes: No results for input(s): CKTOTAL, CKMB, CKMBINDEX, TROPONINI in the last 168 hours.  Lipid Panel:  Recent Labs Lab 07/29/14 0511  CHOL 183  TRIG 53  HDL 47  CHOLHDL 3.9  VLDL 11  LDLCALC 125*    CBG:  Recent Labs Lab 07/28/14 1838 07/29/14 0750  GLUCAP 97 115*    Microbiology: Results for orders placed or performed during the hospital encounter of 05/07/12  MRSA PCR Screening     Status: None   Collection Time: 05/08/12  6:40 AM  Result Value Ref Range Status   MRSA by PCR NEGATIVE NEGATIVE Final    Comment:        The GeneXpert MRSA Assay (FDA approved for NASAL specimens only), is one component of a comprehensive MRSA colonization surveillance program. It is not intended to diagnose MRSA infection nor to guide or monitor treatment for MRSA infections.    Coagulation Studies:  Recent Labs  07/28/14 0957  LABPROT 14.6  INR 1.12    Imaging: Ct Head Wo Contrast  07/28/2014   CLINICAL DATA:  Right-sided facial droop.  EXAM: CT HEAD WITHOUT CONTRAST  TECHNIQUE: Contiguous axial images were obtained from the base of the skull through the vertex without intravenous contrast.  COMPARISON:  CT scan of July 26, 2014.  FINDINGS: Bony calvarium appears intact. Moderate diffuse cortical atrophy is noted. Mild chronic ischemic white matter disease is noted. No mass effect or midline shift is noted. Ventricular size is within normal limits. There is no evidence of mass lesion or hemorrhage. Ill-defined low density is noted in the left side of the pons which is consistent with infarction of indeterminate age. It cannot be determined with certainty if it was or was not present on prior exam.  IMPRESSION: Moderate diffuse cortical atrophy. Mild chronic ischemic white matter disease. Ill-defined low density seen in the left side of  the pons consistent with infarction of indeterminate age. MRI may be performed for further evaluation. No hemorrhage is noted. Attempts to contact Dr. Doy Mince or the [facility] emergent department were unsuccessful. These results will be called to the ordering clinician or representative by the Radiologist Assistant, and communication documented in the PACS or zVision Dashboard.   Electronically Signed   By: Marijo Conception, M.D.   On: 07/28/2014 10:27   Mri Brain Without Contrast  07/28/2014   CLINICAL DATA:  New onset of right-sided facial droop in decreased sensation in the right upper extremity.  EXAM: MRI HEAD WITHOUT CONTRAST  MRA HEAD WITHOUT CONTRAST  TECHNIQUE: Multiplanar, multiecho pulse sequences of the brain and surrounding structures were obtained without intravenous contrast. Angiographic images of the head were obtained using MRA technique without contrast.  COMPARISON:  CT head without contrast 07/28/2014 and 07/26/2014  FINDINGS: MRI HEAD FINDINGS  The diffusion-weighted images demonstrate no evidence for acute or subacute infarction. Moderate atrophy and confluent periventricular T2 change likely reflects the sequela of chronic microvascular ischemia. Remote lacunar infarcts are present in the basal ganglia and right thalamus. Dilated perivascular spaces are present within the basal ganglia bilaterally. No acute infarct or hemorrhage is present. A remote lacunar infarct is present within the central pons. White matter changes extend into the brainstem.  Flow is present in the major intracranial arteries. The globes and orbits are intact. The paranasal sinuses and left mastoid air cells are clear. There is some fluid in the right mastoid air cells. Fluid is noted within the posterior oropharynx as well.  MRA HEAD FINDINGS  The internal carotid arteries demonstrate mild irregularity in the cavernous segments without focal stenosis. May are otherwise within normal limits to the ICA termini. The  A1 and M1 segments are normal. No definite anterior communicating artery is present. The MCA bifurcations are intact. There is moderate attenuation of distal MCA branch vessels bilaterally, more prominent on the right.  The left vertebral artery is slightly dominant to the right. PICA origins are visualized and normal. The basilar artery is within normal limits. Both posterior cerebral arteries originate from the basilar tip. There is moderate attenuation of distal PCA branch vessels.  IMPRESSION: 1. No acute or focal intracranial abnormality explain facial droop or decreased sensation. 2. Moderate generalized atrophy and white matter disease likely reflects the sequela chronic microvascular ischemia. 3. The MRA demonstrates moderate distal small vessel disease without significant proximal stenosis, aneurysm, or branch vessel occlusion. 4. MCA branch vessel attenuation is more prominent right than left. 5. Remote lacunar infarcts of the basal ganglia and right thalamus. 6. Remote lacunar infarct within the central pons.   Electronically Signed   By: San Morelle M.D.   On: 07/28/2014 17:44  Mr Jodene Nam Head/brain Wo Cm  07/28/2014   CLINICAL DATA:  New onset of right-sided facial droop in decreased sensation in the right upper extremity.  EXAM: MRI HEAD WITHOUT CONTRAST  MRA HEAD WITHOUT CONTRAST  TECHNIQUE: Multiplanar, multiecho pulse sequences of the brain and surrounding structures were obtained without intravenous contrast. Angiographic images of the head were obtained using MRA technique without contrast.  COMPARISON:  CT head without contrast 07/28/2014 and 07/26/2014  FINDINGS: MRI HEAD FINDINGS  The diffusion-weighted images demonstrate no evidence for acute or subacute infarction. Moderate atrophy and confluent periventricular T2 change likely reflects the sequela of chronic microvascular ischemia. Remote lacunar infarcts are present in the basal ganglia and right thalamus. Dilated perivascular  spaces are present within the basal ganglia bilaterally. No acute infarct or hemorrhage is present. A remote lacunar infarct is present within the central pons. White matter changes extend into the brainstem.  Flow is present in the major intracranial arteries. The globes and orbits are intact. The paranasal sinuses and left mastoid air cells are clear. There is some fluid in the right mastoid air cells. Fluid is noted within the posterior oropharynx as well.  MRA HEAD FINDINGS  The internal carotid arteries demonstrate mild irregularity in the cavernous segments without focal stenosis. May are otherwise within normal limits to the ICA termini. The A1 and M1 segments are normal. No definite anterior communicating artery is present. The MCA bifurcations are intact. There is moderate attenuation of distal MCA branch vessels bilaterally, more prominent on the right.  The left vertebral artery is slightly dominant to the right. PICA origins are visualized and normal. The basilar artery is within normal limits. Both posterior cerebral arteries originate from the basilar tip. There is moderate attenuation of distal PCA branch vessels.  IMPRESSION: 1. No acute or focal intracranial abnormality explain facial droop or decreased sensation. 2. Moderate generalized atrophy and white matter disease likely reflects the sequela chronic microvascular ischemia. 3. The MRA demonstrates moderate distal small vessel disease without significant proximal stenosis, aneurysm, or branch vessel occlusion. 4. MCA branch vessel attenuation is more prominent right than left. 5. Remote lacunar infarcts of the basal ganglia and right thalamus. 6. Remote lacunar infarct within the central pons.   Electronically Signed   By: San Morelle M.D.   On: 07/28/2014 17:44    Medications:  I have reviewed the patient's current medications. Scheduled: . donepezil  10 mg Oral QHS  . DULoxetine  20 mg Oral BID  . heparin  5,000 Units  Subcutaneous 3 times per day  . latanoprost  1 drop Both Eyes QPM  . memantine  28 mg Oral Daily  . risperiDONE  0.25 mg Oral Daily  . risperiDONE  0.5 mg Oral QHS    Assessment/Plan: Patient remains lethargic and responds little.  MRI of the brain reviewed and shows no acute changes.  MRA shows no large vessel occlusion.  Patient afebrile.  White blood cell count normal.    Recommendations: 1.  D/C Risperdol  2.  EEG    LOS: 1 day   Alexis Goodell, MD [facility] [phone] 07/29/2014  11:21 AM

## 2014-07-29 NOTE — Progress Notes (Signed)
EEG completed, results pending. 

## 2014-07-29 NOTE — Progress Notes (Signed)
[facility] PROGRESS NOTE  [name] SWF:[id number] DOB: 11-13-1922 DOA: 07/28/2014 PCP: Velna Hatchet, MD  Assessment/Plan: Acute on chronic encephalopathy -Chronic component due to moderate to severe dementia. -Remains quite somnolent and difficult to arouse. -Caregivers at bedside state that this is a definite change from her baseline. They stated that these changes started around 2 weeks ago which coincides with when her Risperdal dose was increased from 0.25 mg to 0.5 mg. She had an abrupt change yesterday which prompted transfer to the emergency department. -MRI is negative for acute changes. -Check EEG to rule out seizures, although doubtful. -Urinalysis and chest x-ray are negative for infectious process.  -Appreciate neurology input and recommendations. -Most likely cause for change in status is increased dose of her Risperdal.  Hypertension -Controlled, continue home medications.  Dementia  -continue Aricept and Namenda.  Code Status: DO NOT RESUSCITATE Family Communication: We'll attempt to contact daughter via phone, discussed with caregivers at bedside  Disposition Plan: To be determined, home and ready   Consultants:  None   Antibiotics:  None   Subjective: Lying in bed, can open eyes to prompting and can answer simple questions although is somewhat difficult to understand.  Objective: Filed Vitals:   07/28/14 2348 07/29/14 0219 07/29/14 0527 07/29/14 1303  BP: 156/88 156/94 151/88 140/67  Pulse: 54 75 92 68  Temp: 97.4 F (36.3 C) 98.2 F (36.8 C) 98 F (36.7 C) 98.7 F (37.1 C)  TempSrc: Oral Oral Oral Axillary  Resp: 18 20 20 16   Weight:      SpO2: 93% 94% 95% 95%    Intake/Output Summary (Last 24 hours) at 07/29/14 1529 Last data filed at 07/29/14 0742  Gross per 24 hour  Intake      0 ml  Output      2 ml  Net     -2 ml   Filed Weights   07/28/14 1014 07/28/14 1451  Weight: 54 kg (119 lb 0.8 oz) 47 kg (103 lb 9.9  oz)    Exam:   General:  Awake  Cardiovascular: Regular rate and rhythm  Respiratory: Clear to auscultation bilaterally  Abdomen: Soft, nontender, nondistended, positive bowel sounds  Extremities: No clubbing, cyanosis or edema, positive pulses   Neurologic:  Moves all 4 spontaneously  Data Reviewed: Basic Metabolic Panel:  Recent Labs Lab 07/26/14 2039 07/28/14 0957 07/28/14 1545  NA 137 139  --   K 4.0 3.7  --   CL 106 106  --   CO2 24 24  --   GLUCOSE 108* 110*  --   BUN 26* 19  --   CREATININE 0.69 0.76 0.63  CALCIUM 10.2 10.4*  --    Liver Function Tests:  Recent Labs Lab 07/26/14 2039 07/28/14 0957  AST 19 18  ALT 17 17  ALKPHOS 164* 151*  BILITOT 0.8 0.8  PROT 6.9 6.4*  ALBUMIN 3.8 3.6   No results for input(s): LIPASE, AMYLASE in the last 168 hours. No results for input(s): AMMONIA in the last 168 hours. CBC:  Recent Labs Lab 07/26/14 2039 07/28/14 0957 07/28/14 1545  WBC 8.1 5.4 6.0  NEUTROABS  --  3.7  --   HGB 13.2 13.1 13.0  HCT 38.4 37.4 36.8  MCV 93.0 92.1 91.3  PLT 156 134* 157   Cardiac Enzymes: No results for input(s): CKTOTAL, CKMB, CKMBINDEX, TROPONINI in the last 168 hours. BNP (last 3 results) No results for input(s): BNP in the last 8760 hours.  ProBNP (last 3 results) No results for input(s): PROBNP in the last 8760 hours.  CBG:  Recent Labs Lab 07/28/14 1838 07/29/14 0750 07/29/14 1202  GLUCAP 97 115* 103*    No results found for this or any previous visit (from the past 240 hour(s)).   Studies: Ct Head Wo Contrast  07/28/2014   CLINICAL DATA:  Right-sided facial droop.  EXAM: CT HEAD WITHOUT CONTRAST  TECHNIQUE: Contiguous axial images were obtained from the base of the skull through the vertex without intravenous contrast.  COMPARISON:  CT scan of July 26, 2014.  FINDINGS: Bony calvarium appears intact. Moderate diffuse cortical atrophy is noted. Mild chronic ischemic white matter disease is noted. No mass  effect or midline shift is noted. Ventricular size is within normal limits. There is no evidence of mass lesion or hemorrhage. Ill-defined low density is noted in the left side of the pons which is consistent with infarction of indeterminate age. It cannot be determined with certainty if it was or was not present on prior exam.  IMPRESSION: Moderate diffuse cortical atrophy. Mild chronic ischemic white matter disease. Ill-defined low density seen in the left side of the pons consistent with infarction of indeterminate age. MRI may be performed for further evaluation. No hemorrhage is noted. Attempts to contact Dr. Doy Mince or the [facility] emergent department were unsuccessful. These results will be called to the ordering clinician or representative by the Radiologist Assistant, and communication documented in the PACS or zVision Dashboard.   Electronically Signed   By: Marijo Conception, M.D.   On: 07/28/2014 10:27   Mri Brain Without Contrast  07/28/2014   CLINICAL DATA:  New onset of right-sided facial droop in decreased sensation in the right upper extremity.  EXAM: MRI HEAD WITHOUT CONTRAST  MRA HEAD WITHOUT CONTRAST  TECHNIQUE: Multiplanar, multiecho pulse sequences of the brain and surrounding structures were obtained without intravenous contrast. Angiographic images of the head were obtained using MRA technique without contrast.  COMPARISON:  CT head without contrast 07/28/2014 and 07/26/2014  FINDINGS: MRI HEAD FINDINGS  The diffusion-weighted images demonstrate no evidence for acute or subacute infarction. Moderate atrophy and confluent periventricular T2 change likely reflects the sequela of chronic microvascular ischemia. Remote lacunar infarcts are present in the basal ganglia and right thalamus. Dilated perivascular spaces are present within the basal ganglia bilaterally. No acute infarct or hemorrhage is present. A remote lacunar infarct is present within the central pons. White matter changes  extend into the brainstem.  Flow is present in the major intracranial arteries. The globes and orbits are intact. The paranasal sinuses and left mastoid air cells are clear. There is some fluid in the right mastoid air cells. Fluid is noted within the posterior oropharynx as well.  MRA HEAD FINDINGS  The internal carotid arteries demonstrate mild irregularity in the cavernous segments without focal stenosis. May are otherwise within normal limits to the ICA termini. The A1 and M1 segments are normal. No definite anterior communicating artery is present. The MCA bifurcations are intact. There is moderate attenuation of distal MCA branch vessels bilaterally, more prominent on the right.  The left vertebral artery is slightly dominant to the right. PICA origins are visualized and normal. The basilar artery is within normal limits. Both posterior cerebral arteries originate from the basilar tip. There is moderate attenuation of distal PCA branch vessels.  IMPRESSION: 1. No acute or focal intracranial abnormality explain facial droop or decreased sensation. 2. Moderate generalized atrophy and white matter  disease likely reflects the sequela chronic microvascular ischemia. 3. The MRA demonstrates moderate distal small vessel disease without significant proximal stenosis, aneurysm, or branch vessel occlusion. 4. MCA branch vessel attenuation is more prominent right than left. 5. Remote lacunar infarcts of the basal ganglia and right thalamus. 6. Remote lacunar infarct within the central pons.   Electronically Signed   By: San Morelle M.D.   On: 07/28/2014 17:44   Dg Chest Port 1 View  07/29/2014   CLINICAL DATA:  Acute encephalopathy.  EXAM: PORTABLE CHEST - 1 VIEW  COMPARISON:  07/26/2014  FINDINGS: Lung volumes remain very low bilaterally with bibasilar atelectasis present. Stable tortuosity of the thoracic aorta. No overt infiltrate or edema is seen. No evidence of pneumothorax or significant pleural fluid.  Stable degenerative disease of both shoulders with chronic deformity of the proximal left humerus.  IMPRESSION: Low volumes with bibasilar atelectasis.   Electronically Signed   By: Aletta Edouard M.D.   On: 07/29/2014 13:02   Mr Jodene Nam Head/brain Wo Cm  07/28/2014   CLINICAL DATA:  New onset of right-sided facial droop in decreased sensation in the right upper extremity.  EXAM: MRI HEAD WITHOUT CONTRAST  MRA HEAD WITHOUT CONTRAST  TECHNIQUE: Multiplanar, multiecho pulse sequences of the brain and surrounding structures were obtained without intravenous contrast. Angiographic images of the head were obtained using MRA technique without contrast.  COMPARISON:  CT head without contrast 07/28/2014 and 07/26/2014  FINDINGS: MRI HEAD FINDINGS  The diffusion-weighted images demonstrate no evidence for acute or subacute infarction. Moderate atrophy and confluent periventricular T2 change likely reflects the sequela of chronic microvascular ischemia. Remote lacunar infarcts are present in the basal ganglia and right thalamus. Dilated perivascular spaces are present within the basal ganglia bilaterally. No acute infarct or hemorrhage is present. A remote lacunar infarct is present within the central pons. White matter changes extend into the brainstem.  Flow is present in the major intracranial arteries. The globes and orbits are intact. The paranasal sinuses and left mastoid air cells are clear. There is some fluid in the right mastoid air cells. Fluid is noted within the posterior oropharynx as well.  MRA HEAD FINDINGS  The internal carotid arteries demonstrate mild irregularity in the cavernous segments without focal stenosis. May are otherwise within normal limits to the ICA termini. The A1 and M1 segments are normal. No definite anterior communicating artery is present. The MCA bifurcations are intact. There is moderate attenuation of distal MCA branch vessels bilaterally, more prominent on the right.  The left  vertebral artery is slightly dominant to the right. PICA origins are visualized and normal. The basilar artery is within normal limits. Both posterior cerebral arteries originate from the basilar tip. There is moderate attenuation of distal PCA branch vessels.  IMPRESSION: 1. No acute or focal intracranial abnormality explain facial droop or decreased sensation. 2. Moderate generalized atrophy and white matter disease likely reflects the sequela chronic microvascular ischemia. 3. The MRA demonstrates moderate distal small vessel disease without significant proximal stenosis, aneurysm, or branch vessel occlusion. 4. MCA branch vessel attenuation is more prominent right than left. 5. Remote lacunar infarcts of the basal ganglia and right thalamus. 6. Remote lacunar infarct within the central pons.   Electronically Signed   By: San Morelle M.D.   On: 07/28/2014 17:44    Scheduled Meds: . donepezil  10 mg Oral QHS  . DULoxetine  20 mg Oral BID  . heparin  5,000 Units Subcutaneous 3 times per  day  . latanoprost  1 drop Both Eyes QPM  . memantine  28 mg Oral Daily  . risperiDONE  0.25 mg Oral Daily  . risperiDONE  0.5 mg Oral QHS   Continuous Infusions:   Principal Problem:   TIA (transient ischemic attack) Active Problems:   Chest pain   Hyponatremia   Hypokalemia   HTN (hypertension)   Paget's bone disease   Dementia    Time spent: 35 minutes. Greater than 50% of this time was spent in direct contact with the patient coordinating care.    Lelon Frohlich  [facility] Pager [phone]  If 7PM-7AM, please contact night-coverage at [email or web address], password [facility] 07/29/2014, 3:29 PM  LOS: 1 day

## 2014-07-30 ENCOUNTER — Inpatient Hospital Stay (HOSPITAL_COMMUNITY): Payer: Medicare Other

## 2014-07-30 DIAGNOSIS — G459 Transient cerebral ischemic attack, unspecified: Secondary | ICD-10-CM

## 2014-07-30 LAB — GLUCOSE, CAPILLARY
GLUCOSE-CAPILLARY: 102 mg/dL — AB (ref 65–99)
GLUCOSE-CAPILLARY: 127 mg/dL — AB (ref 65–99)
Glucose-Capillary: 164 mg/dL — ABNORMAL HIGH (ref 65–99)
Glucose-Capillary: 125 mg/dL — ABNORMAL HIGH (ref 65–99)

## 2014-07-30 LAB — AMMONIA: Ammonia: 17 umol/L (ref 9–35)

## 2014-07-30 LAB — HEMOGLOBIN A1C
HEMOGLOBIN A1C: 4.2 % — AB (ref 4.8–5.6)
MEAN PLASMA GLUCOSE: 74 mg/dL

## 2014-07-30 MED ORDER — SODIUM CHLORIDE 0.9 % IV BOLUS (SEPSIS)
250.0000 mL | Freq: Once | INTRAVENOUS | Status: AC
  Administered 2014-07-30: 250 mL via INTRAVENOUS

## 2014-07-30 NOTE — Social Work (Signed)
CSW spoke with daughter who was able to identify plan of care for patient. Daughter Ruta Hinds [phone]. Daughter stated that she would like patient to be discharged home to continue care with the 24 hour caregivers that are currently caring for the patient. Daughter is requesting prescriptions for a wheelchair and a hospital bed for ongoing care in the home. Daughter would prefer for patient to get OT and PT in the home on alternating days (not receiving both on the same day). Daughter is requesting [facility] services from [place], [employer] or [employer].   CSW will contact RNCM.  No further CSW needs.  CSW signing off.  Christene Lye MSW, [place]

## 2014-07-30 NOTE — Progress Notes (Signed)
Subjective: Still sleepy  Exam: Filed Vitals:   07/30/14 0555  BP: 124/75  Pulse: 92  Temp: 98 F (36.7 C)  Resp: 16   Gen: In bed, NAD MS: Sleepiugn, but rouses to noxious stimuli. Able to tell me her name, follow commands to wiggle toes, squeeze/let go hands.  AV:WPVXY, patient keeps eyes tightly closed and adamantly refuses to let me further exam eom,  Motor: moves all extremities spontaneously.  Sensory:responds to stim bilaterally.    Pertinent Labs: nml tsh  Impression: 81 yo F with dementia and increasing sedation in the setting of increased risperdal dose. There is question of seizure, but I do not think that this is definite and would be hesitant to add another sedating medication given her level of sedation already.  Her exam seems slightly better today compared to yesterday and I would favor continued conservative care.   Recommendations: 1) Will check ammonia 2) will continue to follow   Roland Rack, MD [facility] [phone]  If 7pm- 7am, please page neurology on call as listed in [place].

## 2014-07-30 NOTE — Progress Notes (Signed)
Referral made to social worker for SNF.

## 2014-07-30 NOTE — Evaluation (Addendum)
Occupational Therapy Evaluation Patient Details Name: [name] MRN: [id number] DOB: 03-25-1922 Today's Date: 07/30/2014    History of Present Illness  81 y.o. female with a history of dementia, HTN, constipation and colon cancer who has been experiencing increased confusion for the past 2 weeks that is worsening per home health. Over the past 2 weeks, patient has also been eating and drinking less, has not been feeding herself, and has been wearing depends due to incontinence. At baseline, she is is not incontinent, is able to feed herself and is able to converse verbally. This morning, she was sitting on the toilet when the home health aide noticed right arm and foot flapping that persisted until EMS arrived. The patient did not fall of the toilet and though she was awake, she was not verbally responsive. Patient has not had any recent falls nor does she have a history of seizures. She is currently being treated for a UTI with levofloxacin. Per home health, her risperadone was decreased 4-5 days ago and since then she has been sleeping less.   Clinical Impression   Pt admitted with above. Pt requiring assist with ADLs, PTA. Feel pt will benefit from acute OT to increase strength and independence prior to d/c. Recommending SNF for rehab.     Follow Up Recommendations  SNF;Supervision/Assistance - 24 hour    Equipment Recommendations  Other (comment) (defer to next venue)    Recommendations for Other Services       Precautions / Restrictions Precautions Precautions: Fall Restrictions Weight Bearing Restrictions: No      Mobility Bed Mobility Overal bed mobility: Needs Assistance Bed Mobility: Supine to Sit;Sit to Supine     Supine to sit: Max assist Sit to supine: Total assist   General bed mobility comments: +2 assist given to scoot HOB; assist with trunk to come to sitting position and assisted with hips-pt able to move legs to EOB. Assist with trunk and legs to go  back to supine position.   Transfers Overall transfer level: Needs assistance Equipment used: Rolling walker (2 wheeled);None Transfers: Sit to/from [facility] Sit to Stand: +2 physical assistance;Mod assist Stand pivot transfers: Mod assist;+2 physical assistance       General transfer comment: Caregiver assisted with transfers, but may could have performed with +1 assist. More assist needed for sit to stand from [facility] versus from bed.  Cues given.    Balance  Pt able to sit EOB with no physical assist from OT for balance. Assist for stand pivot transfer and use of RW.                                           ADL Overall ADL's : Needs assistance/impaired     Grooming: Moderate assistance;Sitting;Applying deodorant;Wash/dry face   Upper Body Bathing: Sitting;Moderate assistance   Lower Body Bathing: Maximal assistance;Sit to/from stand Lower Body Bathing Details (indicate cue type and reason): washed peri area and tops of legs Upper Body Dressing : Moderate assistance;Sitting   Lower Body Dressing: Total assistance;Sit to/from stand   Toilet Transfer: Moderate assistance;+2 for physical assistance;Stand-pivot;RW;BSC (used +2-may could have performed with +1)   Toileting- Clothing Manipulation and Hygiene: Maximal assistance;Sit to/from stand Toileting - Clothing Manipulation Details (indicate cue type and reason): pt washed front peri area     Functional mobility during ADLs: +2 physical assist; Moderate assistance;Rolling walker (stand  pivot; used +2 assist but may have been able to do +1) General ADL Comments: Pt indicated she had to use the bathroom. Transferred to [facility] but pt had already urinated some. Assist to change socks, wash, and change gown.      Vision     Perception     Praxis      Pertinent Vitals/Pain Pain Assessment: Faces Faces Pain Scale:  (4-6) Pain Location: LUE with increased ROM; also grimaced when trying to  position pt better in bed (unsure of what was hurting) Pain Descriptors / Indicators: Grimacing Pain Intervention(s): Repositioned;Monitored during session;Other (comment);Limited activity within patient's tolerance (notified nurse)     Hand Dominance     Extremity/Trunk Assessment Upper Extremity Assessment Upper Extremity Assessment: Generalized weakness (seemed to have limited ROM in shoulders; indicated pain with increased Lt shoulder ROM)   Lower Extremity Assessment Lower Extremity Assessment: Defer to PT evaluation   Cervical / Trunk Assessment Cervical / Trunk Assessment: Kyphotic Cervical / Trunk Exceptions: Pt extremely kyphotic,   Communication Communication Communication: HOH   Cognition Arousal/Alertness: Lethargic Behavior During Therapy: Flat affect Overall Cognitive Status: History of cognitive impairments - at baseline (h/o dementia)                     General Comments       Exercises       Shoulder Instructions      Home Living Family/patient expects to be discharged to:: Skilled nursing facility (personal care attendant seems agreeable) Living Arrangements: Spouse/significant other Available Help at Discharge: Personal care attendant;Available 24 hours/day Type of Home: House       Home Layout: One level     Bathroom Shower/Tub: Occupational psychologist: Standard     Home Equipment: Environmental consultant - 2 wheels;Shower seat          Prior Functioning/Environment Level of Independence: Needs assistance  Gait / Transfers Assistance Needed: Pt was ambulatory with RW PTA, caregiver states she did not have to assist, but has been more 2 weeks prior to admission.  ADL's / Homemaking Assistance Needed: Caregiver assists with bed mobility, bathing (pt refused to do this, PTA) and  assist with dressing. Pt able to perform toilet hygiene/clothing managment, PTA.  Communication / Swallowing Assistance Needed: Pt on regular diet PTA.       OT  Diagnosis: Generalized weakness;Acute pain;Cognitive deficits   OT Problem List: Decreased strength;Decreased cognition;Impaired balance (sitting and/or standing);Decreased range of motion;Decreased activity tolerance;Decreased knowledge of use of DME or AE;Decreased knowledge of precautions;Pain   OT Treatment/Interventions: Self-care/ADL training;Therapeutic activities;Patient/family education;Balance training;Cognitive remediation/compensation;DME and/or AE instruction;Therapeutic exercise;Neuromuscular education    OT Goals(Current goals can be found in the care plan section) Acute Rehab OT Goals Patient Stated Goal: when asked pt did not state anything OT Goal Formulation: With patient Time For Goal Achievement: 08/06/14 Potential to Achieve Goals: Good ADL Goals Pt Will Perform Grooming: sitting;with set-up;with supervision (sitting unsupported) Pt Will Perform Lower Body Dressing: sit to/from stand;with max assist Pt Will Transfer to Toilet: bedside commode;with min assist;ambulating Pt Will Perform Toileting - Clothing Manipulation and hygiene: with min assist;sit to/from stand  OT Frequency: Min 2X/week   Barriers to D/C:            Co-evaluation              End of Session Equipment Utilized During Treatment: Gait belt;Rolling walker Nurse Communication: Mobility status;Other (comment) (pain when adjusting in bed; d/c recommendation)  Activity  Tolerance: Patient tolerated treatment well Patient left: in bed;with family/visitor present   Time: 1000-1022 OT Time Calculation (min): 22 min Charges:  OT General Charges $OT Visit: 1 Procedure OT Evaluation $Initial OT Evaluation Tier I: 1 Procedure G-CodesBenito Mccreedy OTR/L [phone] 07/30/2014, 11:02 AM

## 2014-07-30 NOTE — Progress Notes (Signed)
Physical Therapy Treatment Patient Details Name: [name] MRN: [id number] DOB: [birth year] Today's Date: 07/30/2014    History of Present Illness  81 y.o. female with a history of dementia, HTN, constipation and colon cancer who has been experiencing increased confusion for the past 2 weeks that is worsening per home health. Over the past 2 weeks, patient has also been eating and drinking less, has not been feeding herself, and has been wearing depends due to incontinence. At baseline, she is is not incontinent, is able to feed herself and is able to converse verbally. This morning, she was sitting on the toilet when the home health aide noticed right arm and foot flapping that persisted until EMS arrived. The patient did not fall of the toilet and though she was awake, she was not verbally responsive. Patient has not had any recent falls nor does she have a history of seizures. She is currently being treated for a UTI with levofloxacin. Per home health, her risperadone was decreased 4-5 days ago and since then she has been sleeping less.    PT Comments    Pt very fatigued after having participating in OT eval just prior to PT arrival. LE exercises performed in bed with pt grimacing in pain with LLE hip/knee ROM. Pt's caregiver present in room and reports pt has not been eating or drinking well. She has progressively required increased assist over the past 2 weeks. Pt may be appropriate for hospice consult.  Follow Up Recommendations  SNF;Supervision/Assistance - 24 hour     Equipment Recommendations  None recommended by PT    Recommendations for Other Services       Precautions / Restrictions Precautions Precautions: Fall Restrictions Weight Bearing Restrictions: No    Mobility  Bed Mobility Overal bed mobility: Needs Assistance Bed Mobility: Rolling Rolling: Max assist   Supine to sit: Max assist Sit to supine: Total assist   General bed mobility comments: +2 assist  given to scoot HOB; assist with trunk to come to sitting position and assisted with hips-pt able to move legs to EOB. Assist with trunk and legs to go back to supine position.   Transfers Overall transfer level: Needs assistance Equipment used: Rolling walker (2 wheeled);None Transfers: Sit to/from [facility] Sit to Stand: +2 physical assistance;Mod assist Stand pivot transfers: Mod assist;+2 physical assistance       General transfer comment: Caregiver assisted with transfers, but may could have performed with +1 assist. More assist needed for sit to stand from [facility] versus from bed.  Cues given.  Ambulation/Gait                 Stairs            Wheelchair Mobility    Modified Rankin (Stroke Patients Only)       Balance                                    Cognition Arousal/Alertness: Lethargic Behavior During Therapy: Flat affect Overall Cognitive Status: History of cognitive impairments - at baseline                      Exercises General Exercises - Lower Extremity Ankle Circles/Pumps: AROM;Both;10 reps Heel Slides: AAROM;Right;Left;5 reps Hip ABduction/ADduction: AAROM;Right;Left;5 reps Straight Leg Raises: AAROM;Right;Left;5 reps    General Comments        Pertinent Vitals/Pain Pain Assessment: Faces Faces  Pain Scale: Hurts even more Pain Location: LLE with ROM at hip and knee Pain Descriptors / Indicators: Grimacing;Moaning Pain Intervention(s): Repositioned;Limited activity within patient's tolerance    Home Living Family/patient expects to be discharged to:: Skilled nursing facility (personal care attendant seems agreeable) Living Arrangements: Spouse/significant other Available Help at Discharge: Personal care attendant;Available 24 hours/day Type of Home: House     Home Layout: One level Home Equipment: [place] - 2 wheels;Shower seat      Prior Function Level of Independence: Needs assistance   Gait / Transfers Assistance Needed: Pt was ambulatory with RW PTA, caregiver states she did not have to assist, but has been more 2 weeks prior to admission.  ADL's / Homemaking Assistance Needed: Caregiver assists with bed mobility, bathing (pt refused to do this, PTA) and  assist with dressing. Pt able to perform toilet hygiene/clothing managment, PTA.      PT Goals (current goals can now be found in the care plan section) Acute Rehab PT Goals Patient Stated Goal: unable to state PT Goal Formulation: With patient/family Time For Goal Achievement: 08/12/14 Potential to Achieve Goals: Fair Progress towards PT goals: Not progressing toward goals - comment (no progress due to lethargy/fatigue)    Frequency  Min 3X/week    PT Plan Current plan remains appropriate    Co-evaluation             End of Session   Activity Tolerance: Patient limited by fatigue;Patient limited by lethargy Patient left: in bed;with call bell/phone within reach;with family/visitor present     Time: 1050-1107 PT Time Calculation (min) (ACUTE ONLY): 17 min  Charges:  $Therapeutic Exercise: 8-22 mins                    G Codes:      Lorriane Shire 07/30/2014, 11:12 AM

## 2014-07-30 NOTE — Progress Notes (Signed)
  Echocardiogram 2D Echocardiogram has been performed.  [name] 07/30/2014, 2:09 PM

## 2014-07-30 NOTE — Progress Notes (Signed)
Pt has no urine output during nightshift, no urge to urinate. Bladder scanned 195 mL. RN encouraged and offer pt to have some water or juice, but pt refused and did not have any fluid intake whole night. Rogue Bussing NP paged and notified.

## 2014-07-30 NOTE — Clinical Social Work Note (Signed)
Clinical Social Work Assessment  Patient Details  Name: [name] MRN: [id number] Date of Birth: Aug 11, 1922  Date of referral:  07/30/14               Reason for consult:  Facility Placement                Permission sought to share information with:  Facility Sport and exercise psychologist, Family Supports Permission granted to share information::  Yes, Verbal Permission Granted  Name::     [place] care RN  Agency::  [facility]  Relationship::  Caregivers  Contact Information:     Housing/Transportation Living arrangements for the past 2 months:  Single Family Home (with 24 hour caregivers) Source of Information:  Adult Children Patient Interpreter Needed:  None Criminal Activity/Legal Involvement Pertinent to Current Situation/Hospitalization:  No - Comment as needed Significant Relationships:  Adult Children, Spouse, Other(Comment) (Home care givers) Lives with:  Spouse Do you feel safe going back to the place where you live?  Yes Need for family participation in patient care:  Yes (Comment)  Care giving concerns:  Patient and husband live in their private residence and receive 24 hour care. Patient's daughter is primary decision maker for both patient and husband. Daughter is [name] [phone]. She has given permission for medical team to communicate with [facility] for care and support of the patient. [facility] RN is Lucien Mons.   Social Worker assessment / plan:  Daughter stated that she will discuss further with family in order to decide if patient will return home with 24 hour care and [place] or if patient will go to skilled facility.   Employment status:  Retired Health visitor, Managed Care PT Recommendations:  [place], [place] / Referral to community resources:  [place]  Patient/Family's Response to care:  Daughter is active and involved. Patient has limited response. Husband is  present but has difficulty hearing. Patient's 24 hour care givers are also present for ongoing support and care.   Patient/Family's Understanding of and Emotional Response to Diagnosis, Current Treatment, and Prognosis:  Daughter understands what is going on and willing to make a decision about care, whether at home or a SNF. Per Homecare workers, patient's husband may not take the news of patient being placed out of the home well.  Daughter identifies awareness of this as she makes a decision about care for the patient.   Emotional Assessment Appearance:  Appears stated age Attitude/Demeanor/Rapport:   (patient unresponsive) Affect (typically observed):    Orientation:    Alcohol / Substance use:  Not Applicable Psych involvement (Current and /or in the community):  No (Comment)  Discharge Needs  Concerns to be addressed:  No discharge needs identified Readmission within the last 30 days:  No Current discharge risk:  None Barriers to Discharge:  No Barriers Identified   Christene Lye, LCSW 07/30/2014, 12:55 PM

## 2014-07-30 NOTE — Progress Notes (Signed)
[facility] PROGRESS NOTE  [name] HGD:[id number] DOB: 1922/01/23 DOA: 07/28/2014 PCP: Velna Hatchet, MD  Assessment/Plan: Acute on chronic encephalopathy -Believed secondary to effect of Risperdal on top of moderate to severe dementia. -Is a little more awake today. -MRI negative for acute changes, EEG negative for seizure activity, chest x-ray and urine analysis negative for infectious process. -Appreciate follow-up by neurology. -Seen by physical therapy with recommendations for skilled nursing facility, however after social work discuss with daughter, she prefers patient be discharged back home with 24-hour caregivers and PT/OT at home.  Dementia -continue Aricept and Namenda.  Hypertension -Controlled, continue home medications.  Code Status: DO NOT RESUSCITATE Family Communication: Daughter via phone 7/15  Disposition Plan: Home when ready   Consultants:  Neurology   Antibiotics:  None   Subjective: Lying in bed, appears more awake, able to follow simple commands like opening her eyes and below your toes, can tell me her name.  Objective: Filed Vitals:   07/29/14 1303 07/29/14 2125 07/30/14 0555 07/30/14 1443  BP: 140/67 138/86 124/75 125/73  Pulse: 68 84 92 93  Temp: 98.7 F (37.1 C) 98.1 F (36.7 C) 98 F (36.7 C) 98.2 F (36.8 C)  TempSrc: Axillary Oral Oral Oral  Resp: 16 17 16 20   Weight:      SpO2: 95% 94% 96% 95%    Intake/Output Summary (Last 24 hours) at 07/30/14 1617 Last data filed at 07/30/14 1000  Gross per 24 hour  Intake    120 ml  Output      0 ml  Net    120 ml   Filed Weights   07/28/14 1014 07/28/14 1451  Weight: 54 kg (119 lb 0.8 oz) 47 kg (103 lb 9.9 oz)    Exam:   General:  Awake, somnolent  Cardiovascular: Regular rate and rhythm  Respiratory: Clear to auscultation bilaterally  Abdomen: Soft, nontender, nondistended, positive bowel sounds  Extremities: No clubbing, cyanosis or edema, positive  pulses   Neurologic:  Nonfocal, able to follow simple commands, somnolent but arouses to voice  Data Reviewed: Basic Metabolic Panel:  Recent Labs Lab 07/26/14 2039 07/28/14 0957 07/28/14 1545  NA 137 139  --   K 4.0 3.7  --   CL 106 106  --   CO2 24 24  --   GLUCOSE 108* 110*  --   BUN 26* 19  --   CREATININE 0.69 0.76 0.63  CALCIUM 10.2 10.4*  --    Liver Function Tests:  Recent Labs Lab 07/26/14 2039 07/28/14 0957  AST 19 18  ALT 17 17  ALKPHOS 164* 151*  BILITOT 0.8 0.8  PROT 6.9 6.4*  ALBUMIN 3.8 3.6   No results for input(s): LIPASE, AMYLASE in the last 168 hours.  Recent Labs Lab 07/30/14 0935  AMMONIA 17   CBC:  Recent Labs Lab 07/26/14 2039 07/28/14 0957 07/28/14 1545  WBC 8.1 5.4 6.0  NEUTROABS  --  3.7  --   HGB 13.2 13.1 13.0  HCT 38.4 37.4 36.8  MCV 93.0 92.1 91.3  PLT 156 134* 157   Cardiac Enzymes: No results for input(s): CKTOTAL, CKMB, CKMBINDEX, TROPONINI in the last 168 hours. BNP (last 3 results) No results for input(s): BNP in the last 8760 hours.  ProBNP (last 3 results) No results for input(s): PROBNP in the last 8760 hours.  CBG:  Recent Labs Lab 07/29/14 1202 07/29/14 1713 07/29/14 2334 07/30/14 0858 07/30/14 1154  GLUCAP 103* 119* 107*  102* 164*    No results found for this or any previous visit (from the past 240 hour(s)).   Studies: Mri Brain Without Contrast  07/28/2014   CLINICAL DATA:  New onset of right-sided facial droop in decreased sensation in the right upper extremity.  EXAM: MRI HEAD WITHOUT CONTRAST  MRA HEAD WITHOUT CONTRAST  TECHNIQUE: Multiplanar, multiecho pulse sequences of the brain and surrounding structures were obtained without intravenous contrast. Angiographic images of the head were obtained using MRA technique without contrast.  COMPARISON:  CT head without contrast 07/28/2014 and 07/26/2014  FINDINGS: MRI HEAD FINDINGS  The diffusion-weighted images demonstrate no evidence for acute or  subacute infarction. Moderate atrophy and confluent periventricular T2 change likely reflects the sequela of chronic microvascular ischemia. Remote lacunar infarcts are present in the basal ganglia and right thalamus. Dilated perivascular spaces are present within the basal ganglia bilaterally. No acute infarct or hemorrhage is present. A remote lacunar infarct is present within the central pons. White matter changes extend into the brainstem.  Flow is present in the major intracranial arteries. The globes and orbits are intact. The paranasal sinuses and left mastoid air cells are clear. There is some fluid in the right mastoid air cells. Fluid is noted within the posterior oropharynx as well.  MRA HEAD FINDINGS  The internal carotid arteries demonstrate mild irregularity in the cavernous segments without focal stenosis. May are otherwise within normal limits to the ICA termini. The A1 and M1 segments are normal. No definite anterior communicating artery is present. The MCA bifurcations are intact. There is moderate attenuation of distal MCA branch vessels bilaterally, more prominent on the right.  The left vertebral artery is slightly dominant to the right. PICA origins are visualized and normal. The basilar artery is within normal limits. Both posterior cerebral arteries originate from the basilar tip. There is moderate attenuation of distal PCA branch vessels.  IMPRESSION: 1. No acute or focal intracranial abnormality explain facial droop or decreased sensation. 2. Moderate generalized atrophy and white matter disease likely reflects the sequela chronic microvascular ischemia. 3. The MRA demonstrates moderate distal small vessel disease without significant proximal stenosis, aneurysm, or branch vessel occlusion. 4. MCA branch vessel attenuation is more prominent right than left. 5. Remote lacunar infarcts of the basal ganglia and right thalamus. 6. Remote lacunar infarct within the central pons.   Electronically  Signed   By: San Morelle M.D.   On: 07/28/2014 17:44   Dg Chest Port 1 View  07/29/2014   CLINICAL DATA:  Acute encephalopathy.  EXAM: PORTABLE CHEST - 1 VIEW  COMPARISON:  07/26/2014  FINDINGS: Lung volumes remain very low bilaterally with bibasilar atelectasis present. Stable tortuosity of the thoracic aorta. No overt infiltrate or edema is seen. No evidence of pneumothorax or significant pleural fluid. Stable degenerative disease of both shoulders with chronic deformity of the proximal left humerus.  IMPRESSION: Low volumes with bibasilar atelectasis.   Electronically Signed   By: Aletta Edouard M.D.   On: 07/29/2014 13:02   Mr Jodene Nam Head/brain Wo Cm  07/28/2014   CLINICAL DATA:  New onset of right-sided facial droop in decreased sensation in the right upper extremity.  EXAM: MRI HEAD WITHOUT CONTRAST  MRA HEAD WITHOUT CONTRAST  TECHNIQUE: Multiplanar, multiecho pulse sequences of the brain and surrounding structures were obtained without intravenous contrast. Angiographic images of the head were obtained using MRA technique without contrast.  COMPARISON:  CT head without contrast 07/28/2014 and 07/26/2014  FINDINGS: MRI HEAD FINDINGS  The diffusion-weighted images demonstrate no evidence for acute or subacute infarction. Moderate atrophy and confluent periventricular T2 change likely reflects the sequela of chronic microvascular ischemia. Remote lacunar infarcts are present in the basal ganglia and right thalamus. Dilated perivascular spaces are present within the basal ganglia bilaterally. No acute infarct or hemorrhage is present. A remote lacunar infarct is present within the central pons. White matter changes extend into the brainstem.  Flow is present in the major intracranial arteries. The globes and orbits are intact. The paranasal sinuses and left mastoid air cells are clear. There is some fluid in the right mastoid air cells. Fluid is noted within the posterior oropharynx as well.  MRA  HEAD FINDINGS  The internal carotid arteries demonstrate mild irregularity in the cavernous segments without focal stenosis. May are otherwise within normal limits to the ICA termini. The A1 and M1 segments are normal. No definite anterior communicating artery is present. The MCA bifurcations are intact. There is moderate attenuation of distal MCA branch vessels bilaterally, more prominent on the right.  The left vertebral artery is slightly dominant to the right. PICA origins are visualized and normal. The basilar artery is within normal limits. Both posterior cerebral arteries originate from the basilar tip. There is moderate attenuation of distal PCA branch vessels.  IMPRESSION: 1. No acute or focal intracranial abnormality explain facial droop or decreased sensation. 2. Moderate generalized atrophy and white matter disease likely reflects the sequela chronic microvascular ischemia. 3. The MRA demonstrates moderate distal small vessel disease without significant proximal stenosis, aneurysm, or branch vessel occlusion. 4. MCA branch vessel attenuation is more prominent right than left. 5. Remote lacunar infarcts of the basal ganglia and right thalamus. 6. Remote lacunar infarct within the central pons.   Electronically Signed   By: San Morelle M.D.   On: 07/28/2014 17:44    Scheduled Meds: . donepezil  10 mg Oral QHS  . DULoxetine  20 mg Oral BID  . heparin  5,000 Units Subcutaneous 3 times per day  . latanoprost  1 drop Both Eyes QPM  . memantine  28 mg Oral Daily   Continuous Infusions:   Principal Problem:   Encephalopathy acute Active Problems:   Dementia   Chest pain   Hyponatremia   Hypokalemia   HTN (hypertension)   Paget's bone disease    Time spent: 25 minutes. Greater than 50% of this time was spent in direct contact with the patient coordinating care.    Lelon Frohlich  [facility] Pager [phone]  If 7PM-7AM, please contact night-coverage at  [email or web address], password [email or web address] 07/30/2014, 4:17 PM  LOS: 2 days

## 2014-07-31 LAB — URINALYSIS, ROUTINE W REFLEX MICROSCOPIC
BILIRUBIN URINE: NEGATIVE
Glucose, UA: NEGATIVE mg/dL
Hgb urine dipstick: NEGATIVE
Ketones, ur: 15 mg/dL — AB
LEUKOCYTES UA: NEGATIVE
Nitrite: NEGATIVE
PROTEIN: NEGATIVE mg/dL
SPECIFIC GRAVITY, URINE: 1.013 (ref 1.005–1.030)
Urobilinogen, UA: 1 mg/dL (ref 0.0–1.0)
pH: 6.5 (ref 5.0–8.0)

## 2014-07-31 LAB — GLUCOSE, CAPILLARY
GLUCOSE-CAPILLARY: 133 mg/dL — AB (ref 65–99)
GLUCOSE-CAPILLARY: 129 mg/dL — AB (ref 65–99)
Glucose-Capillary: 139 mg/dL — ABNORMAL HIGH (ref 65–99)
Glucose-Capillary: 118 mg/dL — ABNORMAL HIGH (ref 65–99)

## 2014-07-31 NOTE — Progress Notes (Signed)
[facility] PROGRESS NOTE  JOORY GOUGH HGD:[id number] DOB: [birth year] DOA: 07/28/2014 PCP: Velna Hatchet, MD  Assessment/Plan: Acute on chronic encephalopathy -Believed secondary to effect of Risperdal on top of moderate to severe dementia. -Much more alert today. -MRI negative for acute changes, EEG negative for seizure activity, chest x-ray and urine analysis negative for infectious process. -Appreciate follow-up by neurology. -Seen by physical therapy with recommendations for skilled nursing facility, however after social work discussed with daughter, she prefers patient be discharged back home with 24-hour caregivers and PT/OT at home.  Dementia -continue Aricept and Namenda.  Hypertension -Controlled, continue home medications.  Code Status: DO NOT RESUSCITATE Family Communication: Daughter via phone 7/15  Disposition Plan: Home, anticipate 24 hours.   Consultants:  Neurology   Antibiotics:  None   Subjective: Lying in bed, awake, conversing with husband at bedside, able to follow simple commands like opening her eyes and below your toes, can tell me her name.  Objective: Filed Vitals:   07/30/14 1443 07/30/14 2212 07/31/14 0514 07/31/14 1349  BP: 125/73 140/76 148/77 139/74  Pulse: 93 87 81 94  Temp: 98.2 F (36.8 C)  98.3 F (36.8 C) 98.2 F (36.8 C)  TempSrc: Oral  Oral Oral  Resp: 20  18 20   Height:      Weight:      SpO2: 95% 97% 97% 94%    Intake/Output Summary (Last 24 hours) at 07/31/14 1424 Last data filed at 07/31/14 0302  Gross per 24 hour  Intake      0 ml  Output    600 ml  Net   -600 ml   Filed Weights   07/28/14 1014 07/28/14 1451  Weight: 54 kg (119 lb 0.8 oz) 47 kg (103 lb 9.9 oz)    Exam:   General:  Awake, somnolent  Cardiovascular: Regular rate and rhythm  Respiratory: Clear to auscultation bilaterally  Abdomen: Soft, nontender, nondistended, positive bowel sounds  Extremities: No clubbing, cyanosis  or edema, positive pulses   Neurologic:  Nonfocal, able to follow simple commands, somnolent but arouses to voice  Data Reviewed: Basic Metabolic Panel:  Recent Labs Lab 07/26/14 2039 07/28/14 0957 07/28/14 1545  NA 137 139  --   K 4.0 3.7  --   CL 106 106  --   CO2 24 24  --   GLUCOSE 108* 110*  --   BUN 26* 19  --   CREATININE 0.69 0.76 0.63  CALCIUM 10.2 10.4*  --    Liver Function Tests:  Recent Labs Lab 07/26/14 2039 07/28/14 0957  AST 19 18  ALT 17 17  ALKPHOS 164* 151*  BILITOT 0.8 0.8  PROT 6.9 6.4*  ALBUMIN 3.8 3.6   No results for input(s): LIPASE, AMYLASE in the last 168 hours.  Recent Labs Lab 07/30/14 0935  AMMONIA 17   CBC:  Recent Labs Lab 07/26/14 2039 07/28/14 0957 07/28/14 1545  WBC 8.1 5.4 6.0  NEUTROABS  --  3.7  --   HGB 13.2 13.1 13.0  HCT 38.4 37.4 36.8  MCV 93.0 92.1 91.3  PLT 156 134* 157   Cardiac Enzymes: No results for input(s): CKTOTAL, CKMB, CKMBINDEX, TROPONINI in the last 168 hours. BNP (last 3 results) No results for input(s): BNP in the last 8760 hours.  ProBNP (last 3 results) No results for input(s): PROBNP in the last 8760 hours.  CBG:  Recent Labs Lab 07/30/14 1154 07/30/14 1710 07/30/14 2216 07/31/14 0805 07/31/14 1152  GLUCAP 164* 127* 125* 133* 129*    No results found for this or any previous visit (from the past 240 hour(s)).   Studies: No results found.  Scheduled Meds: . donepezil  10 mg Oral QHS  . DULoxetine  20 mg Oral BID  . heparin  5,000 Units Subcutaneous 3 times per day  . latanoprost  1 drop Both Eyes QPM  . memantine  28 mg Oral Daily   Continuous Infusions:   Principal Problem:   Encephalopathy acute Active Problems:   Dementia   Chest pain   Hyponatremia   Hypokalemia   HTN (hypertension)   Paget's bone disease    Time spent: 25 minutes. Greater than 50% of this time was spent in direct contact with the patient coordinating care.    Lelon Frohlich  [facility] Pager [phone]  If 7PM-7AM, please contact night-coverage at [email or web address], password [facility] 07/31/2014, 2:24 PM  LOS: 3 days

## 2014-07-31 NOTE — Progress Notes (Signed)
Subjective: Much more awake.   Exam: Filed Vitals:   07/31/14 0514  BP: 148/77  Pulse: 81  Temp: 98.3 F (36.8 C)  Resp: 18   Gen: In bed, NAD MS: Sleepiugn, but rouses to noxious stimuli. Able to tell me her name, follow commands to wiggle toes, squeeze/let go hands.  HO:OILNZ, patient keeps eyes tightly closed and adamantly refuses to let me further exam eom,  Motor: moves all extremities spontaneously.  Sensory:responds to stim bilaterally.    Pertinent Labs: nml tsh Ammonia wnl  Impression: 81 yo F with dementia and increasing sedation in the setting of increased risperdal dose. She is much improved now that risperdal has been discontinued and therefore I think that this was in large part responsible for her sedation. It may be beneficial to her to have a lower dose, just at bedtime, e.g. 0.25mg  qhs, but the benefits will need to be weighed against the side effect of sedation.   One other option that sometimes will give some benefit is depakote. If this is tried without observed benefit, then I would not continue this medication long term.   There is unfortnately not always an easy answer to managing these symptoms in patients with dementia, and the benefits of any medications need to be weighed against their side effects.   Recommendations: 1) could consider either lower dose risperidone or could consider short term trial of depakote.  2) Neurology to sign off, please call with any further questions or concerns.   Gina Rack, MD [facility] [phone]  If 7pm- 7am, please page neurology on call as listed in [place].

## 2014-08-01 LAB — GLUCOSE, CAPILLARY
GLUCOSE-CAPILLARY: 145 mg/dL — AB (ref 65–99)
Glucose-Capillary: 106 mg/dL — ABNORMAL HIGH (ref 65–99)
Glucose-Capillary: 116 mg/dL — ABNORMAL HIGH (ref 65–99)

## 2014-08-01 NOTE — Progress Notes (Signed)
Patient's daughter Ruta Hinds requesting DME to be delivered any time after 2pm, but prior to patient arriving at home. Also stated that caregiver would provide transport home for patient. Had lengthy conversation with Ms Marijean Bravo explaining [place] orders for RN PT OT SLP, and what DME could be ordered. Ms Marijean Bravo aware that [facility] would call with in 24-48 hours of discharge to set up first home health visit, and that [facility] would contact her for arranging delivery of DME. Ms Marijean Bravo continued to circle back to concerns of her mother's UTI, home medications, and complaints that the physician never called all weekend. CM confirmed that bedside RN Tiffany had paged Dr Jerilee Hoh twice to call her back to discuss questions. Reassured Ms Marijean Bravo that MD would call back when she had a moment to do so. Ms Marijean Bravo given contact information for home health companies by CM, so that she could have this information if necessary. However, Ms Marijean Bravo began calling companies prior to all DME orders being cosigned by MD.  Ms. Marijean Bravo called CM at 2:01 very agitated that DME had not arrived, and went over "her notes" from conversation earlier in the day about what home health needed to be done. Daughter stated that she would not be allowing her mother to go home without the bed there prior to her arrival. CM spoke to Dr Jerilee Hoh who clarified that she did speak with daughter over the weekend, and that patient is medically ready for discharge. Daughter given option of Medicare denial to discharge, but declined. After multiple calls CM confirmed that [facility] will deliver DME by 7pm, and arranged with SW to have patient transported home via [place]. All HH is set up through [facility] and all DME arranged to be delivered by [facility]. Bedside RN Tiffany updated. Ms Marijean Bravo went on to request that new medication list at discharge be faxed to [facility] as they provide medication in daily packets for administration. CM called [facility] to very FAX number and  faxed dc med list. No further home planning needed.

## 2014-08-01 NOTE — Discharge Summary (Signed)
Physician Discharge Summary  [name] TFT:[id number] DOB: August 09, 1922 DOA: 07/28/2014  PCP: Gina Hatchet, MD  Admit date: 07/28/2014 Discharge date: 08/01/2014  Time spent: 45 minutes  Recommendations for Outpatient Follow-up:  -Will be discharged home today with 24 hr caregivers and [facility] services. -SNF was recommended, but daughter refused. -Will need follow up with PCP in 1 week, behavior issues with dementia will need to be addressed at that time.   Discharge Diagnoses:  Principal Problem:   Encephalopathy acute Active Problems:   Dementia   Chest pain   Hyponatremia   Hypokalemia   HTN (hypertension)   Paget's bone disease   Discharge Condition: Stable and improved  Filed Weights   07/28/14 1014 07/28/14 1451  Weight: 54 kg (119 lb 0.8 oz) 47 kg (103 lb 9.9 oz)    History of present illness:  [name] is a 81 y.o. female with a history of dementia, HTN, constipation and colon cancer who has been experiencing increased confusion for the past 2 weeks that is worsening per home health. Over the past 2 weeks, patient has also been eating and drinking less, has not been feeding herself, and has been wearing depends due to incontinence. At baseline, she is is not incontinent, is able to feed herself and is able to converse verbally. This morning, she was sitting on the toilet when the home health aide noticed right arm and foot flapping that persisted until EMS arrived. The patient did not fall of the toilet and though she was awake, she was not verbally responsive. Patient has not had any recent falls nor does she have a history of seizures. She is currently being treated for a UTI with levofloxacin. Per home health, her risperadone was decreased 4-5 days ago and since then she has been sleeping less. Her home nurse also reports that patient has been newly incontinent over the past 2 weeks and has needed to wear depends There has been no foul odor in the urine nor  any other findings She has not had a cough  While in the ED, head CT was negative for a bleed and for an acute ischemic process. Labwork was unremarkable except for a decreased patelet count of 134. Neurology was consulted as patient came in initially as a code stroke.  her initial NIH SS was 13but was not given TPA secondary to unclear when last known normal and her modified Rankin was 4  Hospital Course:   Acute on chronic encephalopathy -Secondary to effect of Risperdal and possibly Ativan on top of moderate to severe dementia. -Mental status is much more alert today and caregivers say that mentally she is back to her baseline although she remains quite weak. -MRI negative for acute changes, EEG negative for seizure activity, chest x-ray and urinalysis negative for acute infectious process. -Has been seen by neurology. -Was seen by physical therapy with recommendations for skilled nursing facility, however after social work discussed this with the daughter, she prefers patient be discharged back home with 24-hour caregivers and home health services, will arrange for today.   Dementia -Continue Aricept and Namenda.  Hypertension -Well-controlled, continue home medications.  Procedures:  None    Consultations:  Neurology  Discharge Instructions  Discharge Instructions    Diet - low sodium heart healthy    Complete by:  As directed      Increase activity slowly    Complete by:  As directed  Medication List    STOP taking these medications        levofloxacin 750 MG tablet  Commonly known as:  LEVAQUIN     LORazepam 0.5 MG tablet  Commonly known as:  ATIVAN     metoprolol 50 MG tablet  Commonly known as:  LOPRESSOR     risperiDONE 0.5 MG tablet  Commonly known as:  RISPERDAL      TAKE these medications        ALIGN 4 MG Caps  Take 1 capsule by mouth daily.     amLODipine 10 MG tablet  Commonly known as:  NORVASC  Take 10 mg by mouth every  morning.     docusate sodium 100 MG capsule  Commonly known as:  COLACE  Take 100 mg by mouth daily as needed for mild constipation.     donepezil 10 MG tablet  Commonly known as:  ARICEPT  Take 10 mg by mouth at bedtime.     DULoxetine 20 MG capsule  Commonly known as:  CYMBALTA  Take 20 mg by mouth 2 (two) times daily.     latanoprost 0.005 % ophthalmic solution  Commonly known as:  XALATAN  Place 1 drop into both eyes every evening.     multivitamin with minerals Tabs tablet  Take 1 tablet by mouth every morning.     NAMENDA XR 28 MG Cp24 24 hr capsule  Generic drug:  memantine  Take 28 mg by mouth daily.     polyethylene glycol packet  Commonly known as:  MIRALAX / GLYCOLAX  Take 17 g by mouth daily as needed for moderate constipation.     risedronate 35 MG tablet  Commonly known as:  ACTONEL  Take 35 mg by mouth every 7 (seven) days. On Tuesdays     Vitamin D 2000 UNITS Caps  Take 2,000 Units by mouth daily.       Allergies  Allergen Reactions  . Percocet [Oxycodone-Acetaminophen]     unknown       Follow-up Information    Follow up with Gina Hatchet, MD. Schedule an appointment as soon as possible for a visit in 1 week.   Specialty:  Internal Medicine   Contact information:   [place]  [id number] [phone]        The results of significant diagnostics from this hospitalization (including imaging, microbiology, ancillary and laboratory) are listed below for reference.    Significant Diagnostic Studies: Ct Head Wo Contrast  07/28/2014   CLINICAL DATA:  Right-sided facial droop.  EXAM: CT HEAD WITHOUT CONTRAST  TECHNIQUE: Contiguous axial images were obtained from the base of the skull through the vertex without intravenous contrast.  COMPARISON:  CT scan of July 26, 2014.  FINDINGS: Bony calvarium appears intact. Moderate diffuse cortical atrophy is noted. Mild chronic ischemic white matter disease is noted. No mass effect or  midline shift is noted. Ventricular size is within normal limits. There is no evidence of mass lesion or hemorrhage. Ill-defined low density is noted in the left side of the pons which is consistent with infarction of indeterminate age. It cannot be determined with certainty if it was or was not present on prior exam.  IMPRESSION: Moderate diffuse cortical atrophy. Mild chronic ischemic white matter disease. Ill-defined low density seen in the left side of the pons consistent with infarction of indeterminate age. MRI may be performed for further evaluation. No hemorrhage is noted. Attempts to contact Dr. Doy Mince or the [facility] emergent department were unsuccessful. These results will be called to the ordering clinician or representative by the Radiologist Assistant, and communication documented in the PACS or zVision Dashboard.   Electronically Signed   By: Marijo Conception, M.D.   On: 07/28/2014 10:27   Ct Head Wo Contrast  07/26/2014   CLINICAL DATA:  81 year old female with dementia and confusion  EXAM: CT HEAD WITHOUT CONTRAST  TECHNIQUE: Contiguous axial images were obtained from the base of the skull through the vertex without intravenous contrast.  COMPARISON:  CT dated 05/22/2013  FINDINGS: The ventricles are dilated and the sulci are prominent compatible with age-related atrophy. Periventricular and deep white matter hypodensities represent chronic microvascular ischemic changes. There is no intracranial hemorrhage. No mass effect or midline shift identified.  The visualized paranasal sinuses and mastoid air cells are well aerated. The calvarium is intact. Stable left temporal calvarial focal lucency is incompletely characterized. MRI may provide better evaluation if clinically indicated.  IMPRESSION: No acute intracranial pathology.  Age-related atrophy and chronic microvascular ischemic disease.  If symptoms persist and there are no contraindications, MRI may provide better evaluation if clinically  indicated.   Electronically Signed   By: Anner Crete M.D.   On: 07/26/2014 22:01   Mri Brain Without Contrast  07/28/2014   CLINICAL DATA:  New onset of right-sided facial droop in decreased sensation in the right upper extremity.  EXAM: MRI HEAD WITHOUT CONTRAST  MRA HEAD WITHOUT CONTRAST  TECHNIQUE: Multiplanar, multiecho pulse sequences of the brain and surrounding structures were obtained without intravenous contrast. Angiographic images of the head were obtained using MRA technique without contrast.  COMPARISON:  CT head without contrast 07/28/2014 and 07/26/2014  FINDINGS: MRI HEAD FINDINGS  The diffusion-weighted images demonstrate no evidence for acute or subacute infarction. Moderate atrophy and confluent periventricular T2 change likely reflects the sequela of chronic microvascular ischemia. Remote lacunar infarcts are present in the basal ganglia and right thalamus. Dilated perivascular spaces are present within the basal ganglia bilaterally. No acute infarct or hemorrhage is present. A remote lacunar infarct is present within the central pons. White matter changes extend into the brainstem.  Flow is present in the major intracranial arteries. The globes and orbits are intact. The paranasal sinuses and left mastoid air cells are clear. There is some fluid in the right mastoid air cells. Fluid is noted within the posterior oropharynx as well.  MRA HEAD FINDINGS  The internal carotid arteries demonstrate mild irregularity in the cavernous segments without focal stenosis. May are otherwise within normal limits to the ICA termini. The A1 and M1 segments are normal. No definite anterior communicating artery is present. The MCA bifurcations are intact. There is moderate attenuation of distal MCA branch vessels bilaterally, more prominent on the right.  The left vertebral artery is slightly dominant to the right. PICA origins are visualized and normal. The basilar artery is within normal limits. Both  posterior cerebral arteries originate from the basilar tip. There is moderate attenuation of distal PCA branch vessels.  IMPRESSION: 1. No acute or focal intracranial abnormality explain facial droop or decreased sensation. 2. Moderate generalized atrophy and white matter disease likely reflects the sequela chronic microvascular ischemia. 3. The MRA demonstrates moderate distal small vessel disease without significant proximal stenosis, aneurysm, or branch vessel occlusion. 4. MCA branch vessel attenuation is more prominent right than left. 5. Remote lacunar infarcts of the basal ganglia and right thalamus. 6. Remote lacunar infarct within the central pons.  Electronically Signed   By: San Morelle M.D.   On: 07/28/2014 17:44   Dg Chest Port 1 View  07/29/2014   CLINICAL DATA:  Acute encephalopathy.  EXAM: PORTABLE CHEST - 1 VIEW  COMPARISON:  07/26/2014  FINDINGS: Lung volumes remain very low bilaterally with bibasilar atelectasis present. Stable tortuosity of the thoracic aorta. No overt infiltrate or edema is seen. No evidence of pneumothorax or significant pleural fluid. Stable degenerative disease of both shoulders with chronic deformity of the proximal left humerus.  IMPRESSION: Low volumes with bibasilar atelectasis.   Electronically Signed   By: Aletta Edouard M.D.   On: 07/29/2014 13:02   Dg Chest Port 1 View  07/26/2014   CLINICAL DATA:  Dementia, progressive weakness, confusion concern for urinary tract infection and pneumonia. Productive cough.  EXAM: PORTABLE CHEST - 1 VIEW  COMPARISON:  05/07/2012, 03/11/2008  FINDINGS: Marked rotation to the right. Low lung volumes. Chronic background interstitial changes with increased basilar scarring/ atelectasis. No definite superimposed pneumonia or edema. No effusion or pneumothorax. Aorta is atherosclerotic and tortuous. Degenerative changes of the spine. Chronic Paget's disease of the proximal left humerus as before. Degenerative changes of  both shoulders.  IMPRESSION: Low volume rotated exam.  Chronic interstitial lung disease and increased basilar atelectasis/ scarring.   Electronically Signed   By: Jerilynn Mages.  Shick M.D.   On: 07/26/2014 21:18   Mr Jodene Nam Head/brain Wo Cm  07/28/2014   CLINICAL DATA:  New onset of right-sided facial droop in decreased sensation in the right upper extremity.  EXAM: MRI HEAD WITHOUT CONTRAST  MRA HEAD WITHOUT CONTRAST  TECHNIQUE: Multiplanar, multiecho pulse sequences of the brain and surrounding structures were obtained without intravenous contrast. Angiographic images of the head were obtained using MRA technique without contrast.  COMPARISON:  CT head without contrast 07/28/2014 and 07/26/2014  FINDINGS: MRI HEAD FINDINGS  The diffusion-weighted images demonstrate no evidence for acute or subacute infarction. Moderate atrophy and confluent periventricular T2 change likely reflects the sequela of chronic microvascular ischemia. Remote lacunar infarcts are present in the basal ganglia and right thalamus. Dilated perivascular spaces are present within the basal ganglia bilaterally. No acute infarct or hemorrhage is present. A remote lacunar infarct is present within the central pons. White matter changes extend into the brainstem.  Flow is present in the major intracranial arteries. The globes and orbits are intact. The paranasal sinuses and left mastoid air cells are clear. There is some fluid in the right mastoid air cells. Fluid is noted within the posterior oropharynx as well.  MRA HEAD FINDINGS  The internal carotid arteries demonstrate mild irregularity in the cavernous segments without focal stenosis. May are otherwise within normal limits to the ICA termini. The A1 and M1 segments are normal. No definite anterior communicating artery is present. The MCA bifurcations are intact. There is moderate attenuation of distal MCA branch vessels bilaterally, more prominent on the right.  The left vertebral artery is slightly  dominant to the right. PICA origins are visualized and normal. The basilar artery is within normal limits. Both posterior cerebral arteries originate from the basilar tip. There is moderate attenuation of distal PCA branch vessels.  IMPRESSION: 1. No acute or focal intracranial abnormality explain facial droop or decreased sensation. 2. Moderate generalized atrophy and white matter disease likely reflects the sequela chronic microvascular ischemia. 3. The MRA demonstrates moderate distal small vessel disease without significant proximal stenosis, aneurysm, or branch vessel occlusion. 4. MCA branch vessel attenuation is more prominent right  than left. 5. Remote lacunar infarcts of the basal ganglia and right thalamus. 6. Remote lacunar infarct within the central pons.   Electronically Signed   By: San Morelle M.D.   On: 07/28/2014 17:44    Microbiology: No results found for this or any previous visit (from the past 240 hour(s)).   Labs: Basic Metabolic Panel:  Recent Labs Lab 07/26/14 2039 07/28/14 0957 07/28/14 1545  NA 137 139  --   K 4.0 3.7  --   CL 106 106  --   CO2 24 24  --   GLUCOSE 108* 110*  --   BUN 26* 19  --   CREATININE 0.69 0.76 0.63  CALCIUM 10.2 10.4*  --    Liver Function Tests:  Recent Labs Lab 07/26/14 2039 07/28/14 0957  AST 19 18  ALT 17 17  ALKPHOS 164* 151*  BILITOT 0.8 0.8  PROT 6.9 6.4*  ALBUMIN 3.8 3.6   No results for input(s): LIPASE, AMYLASE in the last 168 hours.  Recent Labs Lab 07/30/14 0935  AMMONIA 17   CBC:  Recent Labs Lab 07/26/14 2039 07/28/14 0957 07/28/14 1545  WBC 8.1 5.4 6.0  NEUTROABS  --  3.7  --   HGB 13.2 13.1 13.0  HCT 38.4 37.4 36.8  MCV 93.0 92.1 91.3  PLT 156 134* 157   Cardiac Enzymes: No results for input(s): CKTOTAL, CKMB, CKMBINDEX, TROPONINI in the last 168 hours. BNP: BNP (last 3 results) No results for input(s): BNP in the last 8760 hours.  ProBNP (last 3 results) No results for input(s):  PROBNP in the last 8760 hours.  CBG:  Recent Labs Lab 07/31/14 0805 07/31/14 1152 07/31/14 1732 07/31/14 2057 08/01/14 0757  GLUCAP 133* 129* 139* 118* 106*       Signed:  HERNANDEZ ACOSTA,ESTELA  [facility] Pager: [phone] 08/01/2014, 9:25 AM

## 2014-08-01 NOTE — Progress Notes (Signed)
Utilization Review completed. Saxon Crosby RN BSN CM 

## 2014-08-01 NOTE — Care Management Important Message (Signed)
Important Message  Patient Details  Name: [name] MRN: [id number] Date of Birth: [birth year]   Medicare Important Message Given:  Yes-second notification given    Pricilla Handler 08/01/2014, 3:17 PM

## 2014-08-01 NOTE — Progress Notes (Signed)
Physical Therapy Treatment Patient Details Name: [name] MRN: [id number] DOB: 05-Nov-1922 Today's Date: 08/01/2014    History of Present Illness  81 y.o. female with a history of dementia, HTN, and colon cancer. Pt adm with incr confusion. Pt with acute on chronic encephalopathy due to secondary to effect of Risperdal on top of moderate to severe dementia.    PT Comments    Pt making good progress with mobility.  Follow Up Recommendations  Home health PT;Supervision/Assistance - 24 hour (Daughter wants pt to come home with 24 hour hired caregivers)     Equipment Recommendations  None recommended by PT    Recommendations for Other Services       Precautions / Restrictions Precautions Precautions: Fall    Mobility  Bed Mobility Overal bed mobility: Needs Assistance Bed Mobility: Supine to Sit     Supine to sit: Mod assist     General bed mobility comments: Assist to bring trunk up.  Transfers Overall transfer level: Needs assistance Equipment used: Rolling walker (2 wheeled) Transfers: Sit to/from Stand Sit to Stand: Mod assist         General transfer comment: Assist to bring hips up. Had pt place hands on walker to stand in order to encourage anterior wt shift since pt tends to lean posteriorly.  Ambulation/Gait Ambulation/Gait assistance: Min assist Ambulation Distance (Feet): 80 Feet Assistive device: Rolling walker (2 wheeled) Gait Pattern/deviations: Step-through pattern;Decreased step length - right;Decreased step length - left;Shuffle         Stairs            Wheelchair Mobility    Modified Rankin (Stroke Patients Only)       Balance Overall balance assessment: Needs assistance Sitting-balance support: No upper extremity supported;Feet supported Sitting balance-Leahy Scale: Poor Sitting balance - Comments: supervision to intermittent min A to correct posterior lean Postural control: Posterior lean Standing balance support:  Bilateral upper extremity supported Standing balance-Leahy Scale: Poor Standing balance comment: walker and min A for static standing                    Cognition Arousal/Alertness: Awake/alert Behavior During Therapy: WFL for tasks assessed/performed Overall Cognitive Status: History of cognitive impairments - at baseline                      Exercises      General Comments        Pertinent Vitals/Pain Pain Assessment: No/denies pain    Home Living                      Prior Function            PT Goals (current goals can now be found in the care plan section) Acute Rehab PT Goals PT Goal Formulation: Patient unable to participate in goal setting Time For Goal Achievement: 08/08/14 Potential to Achieve Goals: Good Progress towards PT goals: Progressing toward goals    Frequency  Min 3X/week    PT Plan Discharge plan needs to be updated    Co-evaluation             End of Session Equipment Utilized During Treatment: Gait belt Activity Tolerance: Patient tolerated treatment well Patient left: in chair;with call bell/phone within reach;with nursing/sitter in room;with family/visitor present     Time: 0908-0920 PT Time Calculation (min) (ACUTE ONLY): 12 min  Charges:  $Gait Training: 8-22 mins  G Codes:      Talayeh Bruinsma 08/01/2014, 9:38 AM  [facility] PT [phone]

## 2014-08-01 NOTE — Progress Notes (Signed)
Speech Language Pathology Treatment: Dysphagia  Patient Details Name: [name] MRN: [id number] DOB: 23-Jun-1922 Today's Date: 08/01/2014 Time: 8185-6314 SLP Time Calculation (min) (ACUTE ONLY): 15 min  Assessment / Plan / Recommendation Clinical Impression  Dysphagia treatment with pt's home health caregivers present. Per ST notes and caregivers, pt less confused today, no s/s aspiration present with thin liquid or regular texture trials over multiple trials. Min cues provided for small sips. Educated caregivers on texture and swallow precautions. Recommend upgrade to regular diet and thin liquids, no straws, pills crushed. Pt discharging home today.   HPI Other Pertinent Information: 81 y.o. female with a history of dementia, HTN, constipation and colon cancer who has been experiencing increased confusion for the past 2 weeks that is worsening per home health. Over the past 2 weeks, patient has also been eating and drinking less, has not been feeding herself, and has been wearing depends due to incontinence.  Dx TIA vs metabolic encephalopathy; has mod-severe dementia at baseline; failed RN stroke swallow screen.    Pertinent Vitals Pain Assessment: No/denies pain  SLP Plan  All goals met;Discharge SLP treatment due to (comment)    Recommendations Diet recommendations: Regular;Thin liquid Liquids provided via: Cup;No straw Medication Administration: Crushed with puree Supervision: Patient able to self feed;Full supervision/cueing for compensatory strategies Compensations: Slow rate;Small sips/bites Postural Changes and/or Swallow Maneuvers: Seated upright 90 degrees              Oral Care Recommendations: Oral care BID Follow up Recommendations: None Plan: All goals met;Discharge SLP treatment due to (comment)         Houston Siren 08/01/2014, 2:45 PM  Orbie Pyo Colvin Caroli.Ed [employer] [phone]

## 2014-08-01 NOTE — Clinical Social Work Note (Signed)
CSW was notified by [facility] that the patient may need ambulance transport home. CSW has made DC packet, with numbers for transport and placed on patient's chart in case the patient does require transport home. Charge RN made aware of this. CSW signing off.    Liz Beach MSW, [place], [place], [phone]

## 2014-08-01 NOTE — Progress Notes (Signed)
Called [facility] to transport pt. From [facility] to home. Removed IV and caregiver at bedside. Pt. Dressed and ready to be transported home. Faxed discharge meds to [facility]. Received confirmation. Awaiting transport, all needs met. No further needs noted at this time.

## 2014-08-01 NOTE — Care Management Note (Signed)
Case Management Note  Patient Details  Name: SAHIRAH RUDELL MRN: [id number] Date of Birth: 1922-02-23  Subjective/Objective:                 Spoke with Patient's daughter Dillon Bjork. Ms. Marijean Bravo requested [facility] for DME. Requested Hospital Bed, table, and wheelchair. Table is self pay and will be offered to daughter by [facility], other DME is to be delivered after 2pm per her request- Jermaine notified of referral and given contact info for daughter to coordinate delivery. Information requested Faxed to [facility] home care attn Dione at 11:00 08-01-14. Family to provide transportation home. Daughter given CM contact information.   Action/Plan:   Expected Discharge Date:                  Expected Discharge Plan:  [place]  In-House Referral:  Clinical Social Work  Discharge planning Services  CM Consult  Post Acute Care Choice:  Durable Medical Equipment, Home Health Choice offered to:  Adult Children ([place] [phone])  DME Arranged:  Wheelchair manual DME Agency:  [place]:  RN, PT, OT, Speech Therapy HH Agency:  [place]  Status of Service:  Completed, signed off  Medicare Important Message Given:    Date Medicare IM Given:    Medicare IM give by:    Date Additional Medicare IM Given:    Additional Medicare Important Message give by:     If discussed at [place] of Stay Meetings, dates discussed:    Additional Comments:  Carles Collet, RN 08/01/2014, 11:24 AM

## 2014-08-02 NOTE — Progress Notes (Signed)
Pt.was d/c via [facility]  To home with caregiver.

## 2014-08-04 NOTE — ED Provider Notes (Signed)
CSN: [id number]     Arrival date & time 07/26/14  4235 History   First MD Initiated Contact with Patient 07/28/14 1036     Chief Complaint  Patient presents with  . Code Stroke     (Consider location/radiation/quality/duration/timing/severity/associated sxs/prior Treatment) HPI  81 y.o. female with a history of dementia, HTN, constipation and colon cancer who has been experiencing increased confusion for the past 2 weeks that is worsening per home health. Over the past 2 weeks, patient has also been eating and drinking less, has not been feeding herself, and has been wearing depends due to incontinence. At baseline, she is is not incontinent, is able to feed herself and is able to converse verbally. This morning, she was sitting on the toilet when the home health aide noticed right arm and foot flapping that persisted until EMS arrived. The patient did not fall of the toilet and though she was awake, she was not verbally responsive. Patient has not had any recent falls nor does she have a history of seizures.   Past Medical History  Diagnosis Date  . Paget's bone disease   . Hypertension   . Cancer     colon  . Glaucoma   . Constipation   . Dementia    Past Surgical History  Procedure Laterality Date  . Hip arthroplasty     Family History  Problem Relation Age of Onset  . Hypertension Mother   . Hypertension Father    History  Substance Use Topics  . Smoking status: Never Smoker   . Smokeless tobacco: Never Used  . Alcohol Use: No   OB History    No data available     Review of Systems  Level 5 caveat because of dementia.    Allergies  Percocet  Home Medications   Prior to Admission medications   Medication Sig Start Date End Date Taking? Authorizing Provider  amLODipine (NORVASC) 10 MG tablet Take 10 mg by mouth every morning. 05/11/12  Yes Oswald Hillock, MD  Cholecalciferol (VITAMIN D) 2000 UNITS CAPS Take 2,000 Units by mouth daily.   Yes Historical Provider,  MD  docusate sodium (COLACE) 100 MG capsule Take 100 mg by mouth daily as needed for mild constipation.   Yes Historical Provider, MD  donepezil (ARICEPT) 10 MG tablet Take 10 mg by mouth at bedtime.   Yes Historical Provider, MD  DULoxetine (CYMBALTA) 20 MG capsule Take 20 mg by mouth 2 (two) times daily.   Yes Historical Provider, MD  memantine (NAMENDA XR) 28 MG CP24 24 hr capsule Take 28 mg by mouth daily.   Yes Historical Provider, MD  Multiple Vitamin (MULTIVITAMIN WITH MINERALS) TABS Take 1 tablet by mouth every morning.    Yes Historical Provider, MD  polyethylene glycol (MIRALAX / GLYCOLAX) packet Take 17 g by mouth daily as needed for moderate constipation.    Yes Historical Provider, MD  Probiotic Product (ALIGN) 4 MG CAPS Take 1 capsule by mouth daily.   Yes Historical Provider, MD  risedronate (ACTONEL) 35 MG tablet Take 35 mg by mouth every 7 (seven) days. On Tuesdays   Yes Historical Provider, MD  latanoprost (XALATAN) 0.005 % ophthalmic solution Place 1 drop into both eyes every evening.     Historical Provider, MD   BP 121/79 mmHg  Pulse 84  Temp(Src) 97.6 F (36.4 C) (Oral)  Resp 16  Ht 5\' 3"  (1.6 m)  Wt 103 lb 9.9 oz (47 kg)  BMI 18.36 kg/m2  SpO2 97% Physical Exam  Constitutional: She appears well-developed and well-nourished. No distress.  Laying in bed. Occasionally moaning otherwise doesn't appear distressed.   HENT:  Head: Normocephalic and atraumatic.  Eyes: Conjunctivae are normal. Right eye exhibits no discharge. Left eye exhibits no discharge.  Neck: Neck supple.  Cardiovascular: Normal rate, regular rhythm and normal heart sounds.  Exam reveals no gallop and no friction rub.   No murmur heard. Pulmonary/Chest: Effort normal and breath sounds normal. No respiratory distress.  Abdominal: Soft. She exhibits no distension. There is no tenderness.  Musculoskeletal: She exhibits no edema or tenderness.  Neurological: No cranial nerve deficit. She exhibits normal  muscle tone. Coordination normal.  Facial droop? Not following commands consistently.   Skin: Skin is warm and dry.  Psychiatric: She has a normal mood and affect. Her behavior is normal. Thought content normal.  Nursing note and vitals reviewed.   ED Course  Procedures (including critical care time) Labs Review Labs Reviewed  ETHANOL - Abnormal; Notable for the following:    Alcohol, Ethyl (B) 6 (*)    All other components within normal limits  APTT - Abnormal; Notable for the following:    aPTT 38 (*)    All other components within normal limits  CBC - Abnormal; Notable for the following:    Platelets 134 (*)    All other components within normal limits  COMPREHENSIVE METABOLIC PANEL - Abnormal; Notable for the following:    Glucose, Bld 110 (*)    Calcium 10.4 (*)    Total Protein 6.4 (*)    Alkaline Phosphatase 151 (*)    All other components within normal limits  URINALYSIS, ROUTINE W REFLEX MICROSCOPIC (NOT AT [facility]) - Abnormal; Notable for the following:    Ketones, ur 15 (*)    All other components within normal limits  HEMOGLOBIN A1C - Abnormal; Notable for the following:    Hgb A1c MFr Bld 4.2 (*)    All other components within normal limits  LIPID PANEL - Abnormal; Notable for the following:    LDL Cholesterol 125 (*)    All other components within normal limits  GLUCOSE, CAPILLARY - Abnormal; Notable for the following:    Glucose-Capillary 115 (*)    All other components within normal limits  GLUCOSE, CAPILLARY - Abnormal; Notable for the following:    Glucose-Capillary 103 (*)    All other components within normal limits  GLUCOSE, CAPILLARY - Abnormal; Notable for the following:    Glucose-Capillary 119 (*)    All other components within normal limits  GLUCOSE, CAPILLARY - Abnormal; Notable for the following:    Glucose-Capillary 107 (*)    All other components within normal limits  GLUCOSE, CAPILLARY - Abnormal; Notable for the following:     Glucose-Capillary 102 (*)    All other components within normal limits  GLUCOSE, CAPILLARY - Abnormal; Notable for the following:    Glucose-Capillary 164 (*)    All other components within normal limits  GLUCOSE, CAPILLARY - Abnormal; Notable for the following:    Glucose-Capillary 127 (*)    All other components within normal limits  GLUCOSE, CAPILLARY - Abnormal; Notable for the following:    Glucose-Capillary 125 (*)    All other components within normal limits  GLUCOSE, CAPILLARY - Abnormal; Notable for the following:    Glucose-Capillary 133 (*)    All other components within normal limits  GLUCOSE, CAPILLARY - Abnormal; Notable for the following:    Glucose-Capillary 129 (*)  All other components within normal limits  GLUCOSE, CAPILLARY - Abnormal; Notable for the following:    Glucose-Capillary 139 (*)    All other components within normal limits  GLUCOSE, CAPILLARY - Abnormal; Notable for the following:    Glucose-Capillary 118 (*)    All other components within normal limits  GLUCOSE, CAPILLARY - Abnormal; Notable for the following:    Glucose-Capillary 106 (*)    All other components within normal limits  GLUCOSE, CAPILLARY - Abnormal; Notable for the following:    Glucose-Capillary 145 (*)    All other components within normal limits  GLUCOSE, CAPILLARY - Abnormal; Notable for the following:    Glucose-Capillary 116 (*)    All other components within normal limits  PROTIME-INR  DIFFERENTIAL  TSH  CBC  CREATININE, SERUM  GLUCOSE, CAPILLARY  VITAMIN B12  AMMONIA  I-STAT CHEM 8, ED  I-STAT TROPOININ, ED    Imaging Review No results found.   EKG Interpretation   Date/Time:  Thursday July 28 2014 10:14:40 EDT Ventricular Rate:  69 PR Interval:  137 QRS Duration: 87 QT Interval:  398 QTC Calculation: 426 R Axis:   50 Text Interpretation:  Sinus rhythm Atrial premature complexes ED PHYSICIAN  INTERPRETATION AVAILABLE IN [facility] Confirmed by  TEST, Record  ([id number]) on 07/29/2014 7:22:31 AM      MDM   Final diagnoses:  TIA (transient ischemic attack)  Encephalopathy acute    89yF with change in mental status. Possible TIA. Admit for fruther eval.     Virgel Manifold, MD 08/04/14 [phone]

## 2014-10-18 ENCOUNTER — Encounter (HOSPITAL_COMMUNITY): Payer: Self-pay | Admitting: Emergency Medicine

## 2014-10-18 ENCOUNTER — Emergency Department (HOSPITAL_COMMUNITY)
Admission: EM | Admit: 2014-10-18 | Discharge: 2014-10-19 | Disposition: A | Discharge status: Home / Self Care | Payer: Medicare Other | Attending: Emergency Medicine | Admitting: Emergency Medicine

## 2014-10-18 ENCOUNTER — Emergency Department (HOSPITAL_COMMUNITY): Payer: Medicare Other

## 2014-10-18 DIAGNOSIS — H409 Unspecified glaucoma: Secondary | ICD-10-CM | POA: Insufficient documentation

## 2014-10-18 DIAGNOSIS — Z8739 Personal history of other diseases of the musculoskeletal system and connective tissue: Secondary | ICD-10-CM | POA: Diagnosis not present

## 2014-10-18 DIAGNOSIS — E860 Dehydration: Secondary | ICD-10-CM

## 2014-10-18 DIAGNOSIS — Z85038 Personal history of other malignant neoplasm of large intestine: Secondary | ICD-10-CM | POA: Insufficient documentation

## 2014-10-18 DIAGNOSIS — Z79899 Other long term (current) drug therapy: Secondary | ICD-10-CM | POA: Diagnosis not present

## 2014-10-18 DIAGNOSIS — R531 Weakness: Secondary | ICD-10-CM | POA: Diagnosis present

## 2014-10-18 DIAGNOSIS — F0390 Unspecified dementia without behavioral disturbance: Secondary | ICD-10-CM | POA: Diagnosis not present

## 2014-10-18 DIAGNOSIS — Z8719 Personal history of other diseases of the digestive system: Secondary | ICD-10-CM | POA: Diagnosis not present

## 2014-10-18 DIAGNOSIS — I10 Essential (primary) hypertension: Secondary | ICD-10-CM | POA: Insufficient documentation

## 2014-10-18 LAB — CBC WITH DIFFERENTIAL/PLATELET
Basophils Absolute: 0 10*3/uL (ref 0.0–0.1)
Basophils Relative: 0 %
EOS ABS: 0.1 10*3/uL (ref 0.0–0.7)
EOS PCT: 1 %
HCT: 39.8 % (ref 36.0–46.0)
Hemoglobin: 14.1 g/dL (ref 12.0–15.0)
LYMPHS ABS: 0.7 10*3/uL (ref 0.7–4.0)
LYMPHS PCT: 9 %
MCH: 32.5 pg (ref 26.0–34.0)
MCHC: 35.4 g/dL (ref 30.0–36.0)
MCV: 91.7 fL (ref 78.0–100.0)
MONO ABS: 0.6 10*3/uL (ref 0.1–1.0)
Monocytes Relative: 8 %
Neutro Abs: 6.5 10*3/uL (ref 1.7–7.7)
Neutrophils Relative %: 82 %
PLATELETS: 164 10*3/uL (ref 150–400)
RBC: 4.34 MIL/uL (ref 3.87–5.11)
RDW: 12.3 % (ref 11.5–15.5)
WBC: 8 10*3/uL (ref 4.0–10.5)

## 2014-10-18 LAB — URINALYSIS, ROUTINE W REFLEX MICROSCOPIC
BILIRUBIN URINE: NEGATIVE
GLUCOSE, UA: NEGATIVE mg/dL
HGB URINE DIPSTICK: NEGATIVE
KETONES UR: 15 mg/dL — AB
Nitrite: NEGATIVE
PH: 5.5 (ref 5.0–8.0)
PROTEIN: NEGATIVE mg/dL
Specific Gravity, Urine: 1.014 (ref 1.005–1.030)
Urobilinogen, UA: 0.2 mg/dL (ref 0.0–1.0)

## 2014-10-18 LAB — COMPREHENSIVE METABOLIC PANEL
ALBUMIN: 4 g/dL (ref 3.5–5.0)
ALT: 32 U/L (ref 14–54)
AST: 29 U/L (ref 15–41)
Alkaline Phosphatase: 125 U/L (ref 38–126)
Anion gap: 6 (ref 5–15)
BUN: 13 mg/dL (ref 6–20)
CO2: 26 mmol/L (ref 22–32)
Calcium: 10.4 mg/dL — ABNORMAL HIGH (ref 8.9–10.3)
Chloride: 104 mmol/L (ref 101–111)
Creatinine, Ser: 0.72 mg/dL (ref 0.44–1.00)
GFR calc Af Amer: >60 mL/min (ref >60)
GLUCOSE: 121 mg/dL — AB (ref 65–99)
POTASSIUM: 4 mmol/L (ref 3.5–5.1)
Sodium: 136 mmol/L (ref 135–145)
Total Bilirubin: 0.9 mg/dL (ref 0.3–1.2)
Total Protein: 6.7 g/dL (ref 6.5–8.1)

## 2014-10-18 LAB — TROPONIN I: Troponin I: <0.03 ng/mL (ref <0.031)

## 2014-10-18 LAB — URINE MICROSCOPIC-ADD ON

## 2014-10-18 MED ORDER — SODIUM CHLORIDE 0.9 % IV BOLUS (SEPSIS)
500.0000 mL | Freq: Once | INTRAVENOUS | Status: AC
  Administered 2014-10-18: 500 mL via INTRAVENOUS

## 2014-10-18 MED ORDER — LORAZEPAM 2 MG/ML IJ SOLN
0.2500 mg | Freq: Once | INTRAMUSCULAR | Status: AC
  Administered 2014-10-18: 0.25 mg via INTRAVENOUS
  Filled 2014-10-18: qty 1

## 2014-10-18 NOTE — ED Notes (Signed)
Pt to xray

## 2014-10-18 NOTE — Discharge Instructions (Signed)

## 2014-10-18 NOTE — ED Provider Notes (Signed)
CSN: [id number]     Arrival date & time 10/18/14  1738 History   First MD Initiated Contact with Patient 10/18/14 1926     Chief Complaint  Patient presents with  . Weakness     (Consider location/radiation/quality/duration/timing/severity/associated sxs/prior Treatment) HPI Level V caveat due to dementia Patient presented after being sent in by her home health nurse. Reportedly dehydration. Reportedly has not been eating and drinking as much. Has had a mild cough. Some decreased oral intake. Sent in reportedly for IV fluids. Patient is unable to give me much history.   Past Medical History  Diagnosis Date  . Paget's bone disease   . Hypertension   . Cancer ([place])     colon  . Glaucoma   . Constipation   . Dementia    Past Surgical History  Procedure Laterality Date  . Hip arthroplasty     Family History  Problem Relation Age of Onset  . Hypertension Mother   . Hypertension Father    Social History  Substance Use Topics  . Smoking status: Never Smoker   . Smokeless tobacco: Never Used  . Alcohol Use: No   OB History    No data available     Review of Systems  Unable to perform ROS     Allergies  Percocet  Home Medications   Prior to Admission medications   Medication Sig Start Date End Date Taking? Authorizing Provider  Cholecalciferol (VITAMIN D) 2000 UNITS CAPS Take 2,000 Units by mouth daily.   Yes Historical Provider, MD  docusate sodium (COLACE) 100 MG capsule Take 100 mg by mouth daily as needed for mild constipation.   Yes Historical Provider, MD  DULoxetine (CYMBALTA) 60 MG capsule Take 60 mg by mouth 2 (two) times daily.  10/14/14  Yes Historical Provider, MD  latanoprost (XALATAN) 0.005 % ophthalmic solution Place 1 drop into both eyes every evening.    Yes Historical Provider, MD  LORazepam (ATIVAN) 0.5 MG tablet Take 0.5 mg by mouth every 6 (six) hours as needed for anxiety.  10/17/14  Yes Historical Provider, MD  metoprolol (LOPRESSOR) 50 MG  tablet Take 50-100 mg by mouth 2 (two) times daily. Take 1 tablet (50 mg) in the am & Take 2 tablets (100 mg) at bedtime. 10/14/14  Yes Historical Provider, MD  Multiple Vitamin (MULTIVITAMIN WITH MINERALS) TABS Take 1 tablet by mouth every morning.    Yes Historical Provider, MD  polyethylene glycol (MIRALAX / GLYCOLAX) packet Take 17 g by mouth daily as needed for moderate constipation.    Yes Historical Provider, MD  Probiotic Product (ALIGN) 4 MG CAPS Take 1 capsule by mouth daily.   Yes Historical Provider, MD  risperiDONE (RISPERDAL) 0.5 MG tablet Take 0.5 mg by mouth daily.  10/14/14  Yes Historical Provider, MD   BP 150/89 mmHg  Pulse 83  Temp(Src) 100.1 F (37.8 C) (Rectal)  Resp 24  Ht 5\' 2"  (1.575 m)  Wt 110 lb (49.896 kg)  BMI 20.11 kg/m2  SpO2 94% Physical Exam  Constitutional: She appears well-developed.  HENT:  Head: Normocephalic.  Neck: Neck supple.  Cardiovascular: Normal rate.   Pulmonary/Chest: Effort normal.  Abdominal: Soft. There is no tenderness.  Musculoskeletal: She exhibits no tenderness.  Neurological: She is alert.  Some confusion, at baseline per caregiver's.  Skin: Skin is warm.    ED Course  Procedures (including critical care time) Labs Review Labs Reviewed  COMPREHENSIVE METABOLIC PANEL - Abnormal; Notable for the following:  Glucose, Bld 121 (*)    Calcium 10.4 (*)    All other components within normal limits  URINALYSIS, ROUTINE W REFLEX MICROSCOPIC (NOT AT [facility]) - Abnormal; Notable for the following:    Ketones, ur 15 (*)    Leukocytes, UA SMALL (*)    All other components within normal limits  CBC WITH DIFFERENTIAL/PLATELET  TROPONIN I  URINE MICROSCOPIC-ADD ON    Imaging Review Dg Chest 2 View  10/18/2014   CLINICAL DATA:  Fever, weakness and dyspnea.  EXAM: CHEST  2 VIEW  COMPARISON:  07/29/2014  FINDINGS: Go mild opacity in the upper right hemi thorax likely represents superimposition of tortuous vasculature, ribs and scapula.  The lungs are grossly clear except minimal linear scarring or atelectasis. There is no confluent airspace consolidation. There is no significant effusion. Mild prominence of the interstitial markings in the basilar periphery is unchanged over the past several examinations and is likely chronic.  Incidentally noted Paget's disease of the proximal left humerus without significant interval change.  IMPRESSION: No acute cardiopulmonary findings.   Electronically Signed   By: Andreas Newport M.D.   On: 10/18/2014 19:58   I have personally reviewed and evaluated these images and lab results as part of my medical decision-making.   EKG Interpretation None      MDM   Final diagnoses:  Dehydration   patient with mildly decreased mental status. May be dehydrated. Low-grade temperature but no clear sign of source of fever. Has tolerated some orals and will discharge home.  Davonna Belling, MD 10/18/14 [phone]

## 2014-10-18 NOTE — ED Notes (Signed)
Per [facility] pt from Home, Md called and wants pt to be evaluated for dehydration and dry unproductive cough. Hx dementia, pt is oriented to self only. Pt is normally non ambulatory except for moving from bed to chair.

## 2014-10-18 NOTE — ED Notes (Signed)
Pt to radiology.

## 2014-10-18 NOTE — ED Notes (Signed)
Contact Nnmbers for pt: Early Chars, daughter [phone] or [phone]. Zetta Bills:  [phone] = manage's care

## 2014-10-19 NOTE — ED Notes (Addendum)
[facility] called for pt at caregiver request

## 2014-10-24 ENCOUNTER — Emergency Department (HOSPITAL_COMMUNITY): Payer: Medicare Other

## 2014-10-24 ENCOUNTER — Inpatient Hospital Stay (HOSPITAL_COMMUNITY)
Admission: EM | Admit: 2014-10-24 | Discharge: 2014-11-15 | DRG: 871 | Disposition: E | Payer: Medicare Other | Attending: Emergency Medicine | Admitting: Emergency Medicine

## 2014-10-24 ENCOUNTER — Encounter (HOSPITAL_COMMUNITY): Payer: Self-pay | Admitting: *Deleted

## 2014-10-24 DIAGNOSIS — Z85038 Personal history of other malignant neoplasm of large intestine: Secondary | ICD-10-CM

## 2014-10-24 DIAGNOSIS — J189 Pneumonia, unspecified organism: Secondary | ICD-10-CM | POA: Diagnosis not present

## 2014-10-24 DIAGNOSIS — Z96649 Presence of unspecified artificial hip joint: Secondary | ICD-10-CM | POA: Diagnosis present

## 2014-10-24 DIAGNOSIS — Z515 Encounter for palliative care: Secondary | ICD-10-CM | POA: Diagnosis not present

## 2014-10-24 DIAGNOSIS — K5900 Constipation, unspecified: Secondary | ICD-10-CM | POA: Diagnosis present

## 2014-10-24 DIAGNOSIS — R4182 Altered mental status, unspecified: Secondary | ICD-10-CM | POA: Diagnosis present

## 2014-10-24 DIAGNOSIS — A419 Sepsis, unspecified organism: Principal | ICD-10-CM | POA: Diagnosis present

## 2014-10-24 DIAGNOSIS — D649 Anemia, unspecified: Secondary | ICD-10-CM | POA: Diagnosis not present

## 2014-10-24 DIAGNOSIS — R401 Stupor: Secondary | ICD-10-CM | POA: Diagnosis not present

## 2014-10-24 DIAGNOSIS — R739 Hyperglycemia, unspecified: Secondary | ICD-10-CM | POA: Diagnosis present

## 2014-10-24 DIAGNOSIS — I10 Essential (primary) hypertension: Secondary | ICD-10-CM | POA: Diagnosis present

## 2014-10-24 DIAGNOSIS — R1310 Dysphagia, unspecified: Secondary | ICD-10-CM | POA: Diagnosis present

## 2014-10-24 DIAGNOSIS — I471 Supraventricular tachycardia: Secondary | ICD-10-CM | POA: Diagnosis not present

## 2014-10-24 DIAGNOSIS — Z79899 Other long term (current) drug therapy: Secondary | ICD-10-CM

## 2014-10-24 DIAGNOSIS — H409 Unspecified glaucoma: Secondary | ICD-10-CM | POA: Diagnosis present

## 2014-10-24 DIAGNOSIS — F0390 Unspecified dementia without behavioral disturbance: Secondary | ICD-10-CM | POA: Diagnosis present

## 2014-10-24 DIAGNOSIS — G9340 Encephalopathy, unspecified: Secondary | ICD-10-CM | POA: Diagnosis not present

## 2014-10-24 DIAGNOSIS — N390 Urinary tract infection, site not specified: Secondary | ICD-10-CM | POA: Diagnosis present

## 2014-10-24 DIAGNOSIS — E876 Hypokalemia: Secondary | ICD-10-CM | POA: Diagnosis not present

## 2014-10-24 DIAGNOSIS — J9601 Acute respiratory failure with hypoxia: Secondary | ICD-10-CM | POA: Diagnosis present

## 2014-10-24 DIAGNOSIS — M889 Osteitis deformans of unspecified bone: Secondary | ICD-10-CM | POA: Diagnosis present

## 2014-10-24 DIAGNOSIS — Z66 Do not resuscitate: Secondary | ICD-10-CM | POA: Diagnosis present

## 2014-10-24 DIAGNOSIS — R400 Somnolence: Secondary | ICD-10-CM | POA: Diagnosis not present

## 2014-10-24 DIAGNOSIS — R6521 Severe sepsis with septic shock: Secondary | ICD-10-CM | POA: Diagnosis not present

## 2014-10-24 DIAGNOSIS — J690 Pneumonitis due to inhalation of food and vomit: Secondary | ICD-10-CM | POA: Insufficient documentation

## 2014-10-24 LAB — COMPREHENSIVE METABOLIC PANEL
ALK PHOS: 113 U/L (ref 38–126)
ALT: 22 U/L (ref 14–54)
AST: 21 U/L (ref 15–41)
Albumin: 3.5 g/dL (ref 3.5–5.0)
Anion gap: 13 (ref 5–15)
BILIRUBIN TOTAL: 1.2 mg/dL (ref 0.3–1.2)
BUN: 14 mg/dL (ref 6–20)
CALCIUM: 10 mg/dL (ref 8.9–10.3)
CO2: 22 mmol/L (ref 22–32)
CREATININE: 0.77 mg/dL (ref 0.44–1.00)
Chloride: 98 mmol/L — ABNORMAL LOW (ref 101–111)
GFR calc Af Amer: >60 mL/min (ref >60)
Glucose, Bld: 205 mg/dL — ABNORMAL HIGH (ref 65–99)
POTASSIUM: 3.6 mmol/L (ref 3.5–5.1)
Sodium: 133 mmol/L — ABNORMAL LOW (ref 135–145)
TOTAL PROTEIN: 6.6 g/dL (ref 6.5–8.1)

## 2014-10-24 LAB — CBC
HEMATOCRIT: 42.5 % (ref 36.0–46.0)
HEMOGLOBIN: 14.7 g/dL (ref 12.0–15.0)
MCH: 31.5 pg (ref 26.0–34.0)
MCHC: 34.6 g/dL (ref 30.0–36.0)
MCV: 91.2 fL (ref 78.0–100.0)
Platelets: 200 10*3/uL (ref 150–400)
RBC: 4.66 MIL/uL (ref 3.87–5.11)
RDW: 12.4 % (ref 11.5–15.5)
WBC: 11.3 10*3/uL — ABNORMAL HIGH (ref 4.0–10.5)

## 2014-10-24 LAB — I-STAT CHEM 8, ED
BUN: 16 mg/dL (ref 6–20)
CALCIUM ION: 1.36 mmol/L — AB (ref 1.13–1.30)
CREATININE: 0.7 mg/dL (ref 0.44–1.00)
Chloride: 100 mmol/L — ABNORMAL LOW (ref 101–111)
Glucose, Bld: 211 mg/dL — ABNORMAL HIGH (ref 65–99)
HCT: 45 % (ref 36.0–46.0)
Hemoglobin: 15.3 g/dL — ABNORMAL HIGH (ref 12.0–15.0)
POTASSIUM: 3.5 mmol/L (ref 3.5–5.1)
Sodium: 137 mmol/L (ref 135–145)
TCO2: 24 mmol/L (ref 0–100)

## 2014-10-24 LAB — I-STAT ARTERIAL BLOOD GAS, ED
ACID-BASE DEFICIT: 2 mmol/L (ref 0.0–2.0)
BICARBONATE: 24.2 meq/L — AB (ref 20.0–24.0)
O2 SAT: 100 %
PO2 ART: 339 mmHg — AB (ref 80.0–100.0)
TCO2: 26 mmol/L (ref 0–100)
pCO2 arterial: 44.1 mmHg (ref 35.0–45.0)
pH, Arterial: 7.342 — ABNORMAL LOW (ref 7.350–7.450)

## 2014-10-24 LAB — DIFFERENTIAL
BASOS ABS: 0 10*3/uL (ref 0.0–0.1)
Basophils Relative: 0 %
EOS ABS: 0.2 10*3/uL (ref 0.0–0.7)
Eosinophils Relative: 1 %
LYMPHS ABS: 1.6 10*3/uL (ref 0.7–4.0)
LYMPHS PCT: 15 %
MONOS PCT: 9 %
Monocytes Absolute: 1.1 10*3/uL — ABNORMAL HIGH (ref 0.1–1.0)
Neutro Abs: 8.4 10*3/uL — ABNORMAL HIGH (ref 1.7–7.7)
Neutrophils Relative %: 75 %

## 2014-10-24 LAB — APTT: APTT: 33 s (ref 24–37)

## 2014-10-24 LAB — I-STAT TROPONIN, ED: TROPONIN I, POC: 0.03 ng/mL (ref 0.00–0.08)

## 2014-10-24 LAB — PROTIME-INR
INR: 1.28 (ref 0.00–1.49)
Prothrombin Time: 16.1 seconds — ABNORMAL HIGH (ref 11.6–15.2)

## 2014-10-24 LAB — ETHANOL: Alcohol, Ethyl (B): <5 mg/dL (ref <5)

## 2014-10-24 MED ORDER — MIDAZOLAM HCL 2 MG/2ML IJ SOLN
1.0000 mg | INTRAMUSCULAR | Status: DC | PRN
  Administered 2014-10-25: 1 mg via INTRAVENOUS
  Filled 2014-10-24: qty 2

## 2014-10-24 MED ORDER — SODIUM CHLORIDE 0.9 % IV SOLN
1.5000 g | Freq: Four times a day (QID) | INTRAVENOUS | Status: DC
  Administered 2014-10-25 – 2014-10-26 (×7): 1.5 g via INTRAVENOUS
  Filled 2014-10-24 (×12): qty 1.5

## 2014-10-24 MED ORDER — ROCURONIUM BROMIDE 50 MG/5ML IV SOLN
50.0000 mg | Freq: Once | INTRAVENOUS | Status: AC
  Administered 2014-10-24: 50 mg via INTRAVENOUS
  Filled 2014-10-24: qty 5

## 2014-10-24 MED ORDER — PHENYLEPHRINE 40 MCG/ML (10ML) SYRINGE FOR IV PUSH (FOR BLOOD PRESSURE SUPPORT)
120.0000 ug | PREFILLED_SYRINGE | Freq: Once | INTRAVENOUS | Status: DC
  Filled 2014-10-24: qty 3

## 2014-10-24 MED ORDER — ETOMIDATE 2 MG/ML IV SOLN
15.0000 mg | Freq: Once | INTRAVENOUS | Status: AC
  Administered 2014-10-24: 15 mg via INTRAVENOUS

## 2014-10-24 MED ORDER — FENTANYL CITRATE (PF) 100 MCG/2ML IJ SOLN
50.0000 ug | INTRAMUSCULAR | Status: DC | PRN
  Administered 2014-10-24: 50 ug via INTRAVENOUS

## 2014-10-24 MED ORDER — MIDAZOLAM HCL 2 MG/2ML IJ SOLN
1.0000 mg | INTRAMUSCULAR | Status: DC | PRN
  Administered 2014-10-24: 1 mg via INTRAVENOUS
  Filled 2014-10-24: qty 2

## 2014-10-24 MED ORDER — SODIUM CHLORIDE 0.9 % IV SOLN: 250.0000 mL | INTRAVENOUS | Status: DC | PRN

## 2014-10-24 MED ORDER — PROPOFOL 1000 MG/100ML IV EMUL
0.0000 ug/kg/min | INTRAVENOUS | Status: DC
  Administered 2014-10-24: 5 ug/kg/min via INTRAVENOUS
  Filled 2014-10-24: qty 100

## 2014-10-24 MED ORDER — FAMOTIDINE 40 MG/5ML PO SUSR
20.0000 mg | Freq: Two times a day (BID) | ORAL | Status: DC
  Filled 2014-10-24: qty 2.5

## 2014-10-24 MED ORDER — SODIUM CHLORIDE 0.9 % IV BOLUS (SEPSIS)
1000.0000 mL | Freq: Once | INTRAVENOUS | Status: AC
  Administered 2014-10-24: 1000 mL via INTRAVENOUS

## 2014-10-24 MED ORDER — ENOXAPARIN SODIUM 30 MG/0.3ML ~~LOC~~ SOLN
30.0000 mg | SUBCUTANEOUS | Status: DC
  Administered 2014-10-25 – 2014-10-26 (×2): 30 mg via SUBCUTANEOUS
  Filled 2014-10-24 (×2): qty 0.3

## 2014-10-24 MED ORDER — FENTANYL CITRATE (PF) 100 MCG/2ML IJ SOLN
50.0000 ug | INTRAMUSCULAR | Status: DC | PRN
  Filled 2014-10-24: qty 2

## 2014-10-24 MED ORDER — PHENYLEPHRINE HCL 10 MG/ML IJ SOLN
30.0000 ug/min | INTRAMUSCULAR | Status: DC
  Administered 2014-10-25: 30 ug/min via INTRAVENOUS
  Administered 2014-10-25: 100 ug/min via INTRAVENOUS
  Administered 2014-10-25: 30 ug/min via INTRAVENOUS
  Administered 2014-10-25: 50 ug/min via INTRAVENOUS
  Administered 2014-10-25: 30 ug/min via INTRAVENOUS
  Administered 2014-10-26: 15 ug/min via INTRAVENOUS
  Filled 2014-10-24 (×5): qty 1

## 2014-10-24 MED ORDER — SODIUM CHLORIDE 0.9 % IV SOLN
INTRAVENOUS | Status: DC
  Administered 2014-10-25 – 2014-10-26 (×2): via INTRAVENOUS

## 2014-10-24 NOTE — H&P (Signed)
PULMONARY / CRITICAL CARE MEDICINE   Name: [name] MRN: [id number] DOB: 25-Jan-1922    ADMISSION DATE:  11/12/2014 CONSULTATION DATE:  10/10  REFERRING MD :  EDP  CHIEF COMPLAINT:  Altered mental status  INITIAL PRESENTATION: 81 y/o F with h/o HTN, dementia, paget's bone disease with difficulty swallowing and progressively worsening mental status this afternoon.  Intubated in ED for SpO2 in 70s and airway protection.  STUDIES:  CXR (10/10): tortuous trachea, ETT 3.8cm above carina CT Head (10/10): No acute intracranial abnormalities ABG (10/10): 7.342 / 44.1 / 339 / 24.2 EKG (10/10): Sinus tachycardia, nonspecific T wave abnormalities inferior leads  SIGNIFICANT EVENTS: 10/10 - intubation, admit to ICU  HISTORY OF PRESENT ILLNESS:  81 y/o F with h/o HTN, dementia, paget's bone disease noted by [facility] RN around 2pm today to be unable to swallow - they report that she was trying to swallow but food would stay in her mouth.  She had progressive decrease in mental status throughout the day.  [facility] RN also reports that patient more agitated and talking to self for last several nights.    EMS called this evening and patient noted to be unresponsive. Patient bagged for SpO2 70% en route, code stroke called.  On arrival, patient intubated in ED for airway protection.  Of note, multiple [facility] RNs and previous admission document DNR status.  PAST MEDICAL HISTORY :   has a past medical history of Paget's bone disease; Hypertension; Cancer ([place]); Glaucoma; Constipation; and Dementia.  has past surgical history that includes Hip Arthroplasty. Prior to Admission medications   Medication Sig Start Date End Date Taking? Authorizing Provider  Cholecalciferol (VITAMIN D) 2000 UNITS CAPS Take 2,000 Units by mouth daily.   Yes Historical Provider, MD  docusate sodium (COLACE) 100 MG capsule Take 100 mg by mouth daily as needed for mild constipation.   Yes Historical Provider, MD  DULoxetine (CYMBALTA) 60  MG capsule Take 60 mg by mouth 2 (two) times daily.  10/14/14  Yes Historical Provider, MD  latanoprost (XALATAN) 0.005 % ophthalmic solution Place 1 drop into both eyes every evening.    Yes Historical Provider, MD  LORazepam (ATIVAN) 0.5 MG tablet Take 0.5 mg by mouth every 6 (six) hours as needed for anxiety.  10/17/14  Yes Historical Provider, MD  metoprolol (LOPRESSOR) 50 MG tablet Take 50-100 mg by mouth 2 (two) times daily. Take 1 tablet (50 mg) in the am & Take 2 tablets (100 mg) at bedtime. 10/14/14  Yes Historical Provider, MD  Multiple Vitamin (MULTIVITAMIN WITH MINERALS) TABS Take 1 tablet by mouth every morning.    Yes Historical Provider, MD  polyethylene glycol (MIRALAX / GLYCOLAX) packet Take 17 g by mouth daily as needed for moderate constipation.    Yes Historical Provider, MD  Probiotic Product (ALIGN) 4 MG CAPS Take 1 capsule by mouth daily.   Yes Historical Provider, MD  risperiDONE (RISPERDAL) 0.5 MG tablet Take 0.5 mg by mouth daily.  10/14/14  Yes Historical Provider, MD   Allergies  Allergen Reactions  . Percocet [Oxycodone-Acetaminophen]     unknown    FAMILY HISTORY:  has no family status information on file.  SOCIAL HISTORY:  reports that she has never smoked. She has never used smokeless tobacco. She reports that she does not drink alcohol or use illicit drugs.  REVIEW OF SYSTEMS:  Unable to obtain, patient intubated  SUBJECTIVE:  Unable to obtain hx from pt  VITAL SIGNS: Temp:  [96.5 F (  35.8 C)] 96.5 F (35.8 C) (10/10 2049) Pulse Rate:  [34-138] 97 (10/10 2230) Resp:  [9-35] 15 (10/10 2245) BP: (37-205)/(27-124) 79/51 mmHg (10/10 2245) SpO2:  [93 %-100 %] 97 % (10/10 2230) FiO2 (%):  [40 %-100 %] 40 % (10/10 2105) Weight:  [98 lb 12.3 oz (44.8 kg)] 98 lb 12.3 oz (44.8 kg) (10/10 2002) HEMODYNAMICS:   VENTILATOR SETTINGS: Vent Mode:  [-] PRVC FiO2 (%):  [40 %-100 %] 40 % Set Rate:  [14 bmp-15 bmp] 15 bmp Vt Set:  [450 mL] 450 mL PEEP:  [5 cmH20] 5  cmH20 Plateau Pressure:  [22 cmH20] 22 cmH20 INTAKE / OUTPUT:  Intake/Output Summary (Last 24 hours) at 10/23/2014 2303 Last data filed at 10/21/2014 2145  Gross per 24 hour  Intake   2000 ml  Output      0 ml  Net   2000 ml    PHYSICAL EXAMINATION: General:  Elderly female laying in bed, NAD Neuro:  Does not follow commands, eyes open, withdraws b/l LEs to pain, no response in UEs to painful stimuli HEENT:  NCAT, ETT in place, PERRL Cardiovascular:  RRR, distant heart sounds, no murmur appreciated Lungs:  Coarse breath sounds throughout (R>L) Abdomen:  Soft, NTND, +BS, no HSM Musculoskeletal:  No edema Skin:  No rashes noted  LABS:  CBC  Recent Labs Lab 10/18/14 1958 10/20/2014 2005 11/05/2014 2012  WBC 8.0 11.3*  --   HGB 14.1 14.7 15.3*  HCT 39.8 42.5 45.0  PLT 164 200  --    Coag's  Recent Labs Lab 11/03/2014 2005  APTT 33  INR 1.28   BMET  Recent Labs Lab 10/18/14 1958 10/19/2014 2005 11/11/2014 2012  NA 136 133* 137  K 4.0 3.6 3.5  CL 104 98* 100*  CO2 26 22  --   BUN 13 14 16   CREATININE 0.72 0.77 0.70  GLUCOSE 121* 205* 211*   Electrolytes  Recent Labs Lab 10/18/14 1958 11/08/2014 2005  CALCIUM 10.4* 10.0   Sepsis Markers No results for input(s): LATICACIDVEN, PROCALCITON, O2SATVEN in the last 168 hours. ABG  Recent Labs Lab 10/31/2014 2100  PHART 7.342*  PCO2ART 44.1  PO2ART 339.0*   Liver Enzymes  Recent Labs Lab 10/18/14 1958 11/07/2014 2005  AST 29 21  ALT 32 22  ALKPHOS 125 113  BILITOT 0.9 1.2  ALBUMIN 4.0 3.5   Cardiac Enzymes  Recent Labs Lab 10/18/14 1958  TROPONINI <0.03   Glucose No results for input(s): GLUCAP in the last 168 hours.  Imaging Ct Head Wo Contrast  11/05/2014   CLINICAL DATA:  Code stroke, altered mental status  EXAM: CT HEAD WITHOUT CONTRAST  TECHNIQUE: Contiguous axial images were obtained from the base of the skull through the vertex without intravenous contrast.  COMPARISON:  07/28/2014  FINDINGS:  Severe diffuse atrophy. Moderate low attenuation in the deep white matter. No evidence of mass or vascular territory infarct. No hemorrhage or extra-axial fluid. No hydrocephalus. Calvarium intact. Patchy areas of lucency within the calvarium may reflect osteoporosis or less likely myeloma this change. These are stable. Inflammatory change in the ethmoid air cells noted.  IMPRESSION: No acute intracranial abnormalities. Critical Value/emergent results were called by telephone at the time of interpretation on 10/23/2014 at 8:50 pm to Dr. Doy Mince , who verbally acknowledged these results.   Electronically Signed   By: Skipper Cliche M.D.   On: 10/30/2014 20:48   Dg Chest Portable 1 View  10/30/2014   CLINICAL DATA:  Respiratory failure.  Endotracheal tube.  EXAM: PORTABLE CHEST 1 VIEW  COMPARISON:  10/18/2014  FINDINGS: Endotracheal tube has been placed with tip measuring 3.8 cm above the carina. The trachea is tortuous and the endotracheal tube is directed laterally towards the right within the tracheal shadow. Shallow inspiration. Heart size and pulmonary vascularity are normal. There is scarring versus consolidation and volume loss in the right upper chest. No blunting of costophrenic angles. No pneumothorax. Calcified and tortuous aorta. Coarsening of trabeculation in the proximal left humerus probably representing Paget's disease although chronic osteomyelitis could also have this appearance.  IMPRESSION: Endotracheal tube tip measures 3.8 cm above the carina. The trachea is tortuous and the endotracheal tube is directed laterally towards the right within the tracheal shadow.   Electronically Signed   By: Lucienne Capers M.D.   On: 10/19/2014 21:52     ASSESSMENT / PLAN:  PULMONARY OETT 10/10 >> A: Acute respiratory failure - possible r/t mental status changes, ?aspiration pneumonia  P:   Continue ventilator support pending neurological eval and improvement Given her prior DNR status, will need  to consider and discuss possible withdrawal if no neuro improvement  Empiric abx as below Follow CXR  CARDIOVASCULAR CVL n/a A:  HTN Shock, in part due to sedating meds but suspect evolving sepsis, consider relative AI RUE infiltration phenylephrine P:  Wean phenylephrine as able Follow R AC skin for evidence possible necrosis  Defer CVC placement per the family's wishes Hold home antihypertensives  RENAL A:  No acute issues P:   F/u UA Follow BMP  GASTROINTESTINAL A:  Nutrition P:   Famotidine for GI ppx Hold on tube feeds - possibly start in AM  HEMATOLOGIC A:  Mild leukocytosis P:  Follow CBC Lovenox for DVT ppx   INFECTIOUS A:  Possible aspiration pneumonia - given h/o difficulty swallowing in association with decline in MS Suspected UTI  P:   BCx x2 10/10 >> UC 10/10 >> Sputum 10/10 >> Abx: Unasyn, start date 10/10, day 1/7  Empiric abx to cover possible aspiration and UTI; Unasyn should cover e coli, proteus etc Monitor fever curve Repeat CXR in AM  ENDOCRINE A:  Glucose controlled Hypotension - r/o AI   P:   Monitor glucose on BMET F/u TSH, cortisol  NEUROLOGIC A:  Acute encephalopathy - consider stroke although Head Ct 10/10 without evidence for this;  Consider delirium 2/2 infectious etiology P:   RASS goal: 0 F/u UDS Neurology following MRI/MRA brain Fentanyl/versed prn F/u TSH F/u serum/urine osm  FAMILY  - Updates:  Status discussed with patient's daughter 10/11 am by Jaclynn Guarneri NP - Inter-disciplinary family meet or Palliative Care meeting due by:  10/17  Virginia Crews, MD, MPH PGY-2,  [facility] 10/29/2014 11:03 PM   Attending Note:  I have examined patient, reviewed labs, studies and notes. I have discussed the case with Dr Brita Romp, and I agree with the data and plans as amended above. Patient with a change in mentation, functional capacity over last 48h that included poor swallowing mechanics. Developed  respiratory suppression and then hypoxemic respiratory failure, was intubated. Head CT reassuring. Initial eval for possible infectious causes underway. On my eval post-intubation and stabilization she is poorly responsive on no sedation, does localize and withdraw to pain. Was started on pressors post-intubation. Discussed status with the patient's daughter who does not want continue current support pending workup but does not want to escalate care further. CVC deferred based on this.  Will continue empiric abx, vent support and follow neuro status for improvement. If no neuro improvement evident will need to address GOC and the role of continued MV support. Independent critical care time is 60 minutes.   Baltazar Apo, MD, PhD 10/25/2014, 4:19 AM [facility] [phone] or if no answer [phone]

## 2014-10-24 NOTE — Consult Note (Signed)
Referring Physician: Tomi Bamberger    Chief Complaint: Change in mental status  HPI: [name] is an 81 y.o. female who when noted by [facility] at 2p today was unable to swallow.  Patient was noted to be foaming at the mouth and progressively had a decrease in mental status throughout the day.  EMS was called this evening and patient was able to move all extremities but was unresponsive.  O2 sat at 70%.  Patient bagged. Code stroke called by Ems and patient brought to [facility] ED.  On arrival patient felt to not be protecting her airway and was intubated in the ED prior to going to CT.  Patient then sent to ED but dropped her BP prior to getting on the table and was transported back to the ED due to her instability.  Once more stable patient taken back to CT for further evaluation.  All history obtained from EMS.  Patient unable to provide history.  Family not present.    Date last known well: Date: 10/15/2014 Time last known well: Unable to determine tPA Given: No: Outside time window, patient unstable  MRankin: 5    Past Medical History  Diagnosis Date  . Paget's bone disease   . Hypertension   . Cancer ([place])     colon  . Glaucoma   . Constipation   . Dementia     Past Surgical History  Procedure Laterality Date  . Hip arthroplasty      Family History  Problem Relation Age of Onset  . Hypertension Mother   . Hypertension Father    Social History:  reports that she has never smoked. She has never used smokeless tobacco. She reports that she does not drink alcohol or use illicit drugs.  Allergies:  Allergies  Allergen Reactions  . Percocet [Oxycodone-Acetaminophen]     unknown    Medications: I have reviewed the patient's current medications. Prior to Admission:  Prior to Admission medications   Medication Sig Start Date End Date Taking? Authorizing Provider  Cholecalciferol (VITAMIN D) 2000 UNITS CAPS Take 2,000 Units by mouth daily.    Historical Provider, MD  docusate sodium  (COLACE) 100 MG capsule Take 100 mg by mouth daily as needed for mild constipation.    Historical Provider, MD  DULoxetine (CYMBALTA) 60 MG capsule Take 60 mg by mouth 2 (two) times daily.  10/14/14   Historical Provider, MD  latanoprost (XALATAN) 0.005 % ophthalmic solution Place 1 drop into both eyes every evening.     Historical Provider, MD  LORazepam (ATIVAN) 0.5 MG tablet Take 0.5 mg by mouth every 6 (six) hours as needed for anxiety.  10/17/14   Historical Provider, MD  metoprolol (LOPRESSOR) 50 MG tablet Take 50-100 mg by mouth 2 (two) times daily. Take 1 tablet (50 mg) in the am & Take 2 tablets (100 mg) at bedtime. 10/14/14   Historical Provider, MD  Multiple Vitamin (MULTIVITAMIN WITH MINERALS) TABS Take 1 tablet by mouth every morning.     Historical Provider, MD  polyethylene glycol (MIRALAX / GLYCOLAX) packet Take 17 g by mouth daily as needed for moderate constipation.     Historical Provider, MD  Probiotic Product (ALIGN) 4 MG CAPS Take 1 capsule by mouth daily.    Historical Provider, MD  risperiDONE (RISPERDAL) 0.5 MG tablet Take 0.5 mg by mouth daily.  10/14/14   Historical Provider, MD    ROS: Unable to obtain due to mental status.  Family not present.  Physical Examination: Blood pressure 123/72, pulse 100, temperature 96.5 F (35.8 C), resp. rate 25, weight 44.8 kg (98 lb 12.3 oz), SpO2 100 %.  HEENT-  Normocephalic, no lesions, without obvious abnormality.  Normal external eye and conjunctiva.  Normal TM's bilaterally.  Normal auditory canals and external ears. Normal external nose, mucus membranes and septum.  Normal pharynx. Cardiovascular- S1, S2 normal, pulses palpable throughout   Lungs- chest clear, no wheezing, rales, normal symmetric air entry Abdomen- soft, non-tender; bowel sounds normal; no masses,  no organomegaly Extremities- no edema Lymph-no adenopathy palpable Musculoskeletal-no joint tenderness, deformity or swelling Skin-warm and dry, no  hyperpigmentation, vitiligo, or suspicious lesions  Neurological Examination Mental Status: Obtunded.  Does not follow commands.  Moans but no interpretable speech.   Cranial Nerves: II: Discs flat bilaterally; Pupils pinpoint and poorly reactive III,IV, VI: ptosis not present, patient looks in all visual fields but does not appear to fix.   V,VII: corneals intact bilaterally VIII: unable to test IX,X: unable to test XI: unable to test XII: unable to test Motor: Moving all extremities spontaneously but not to command.  No focal weakness noted.   Sensory: Withdraws to noxious stimuli throughout Deep Tendon Reflexes: Absent throughout Plantars: Right: mute   Left: mute Cerebellar: Unable to test due to ability to follow commands Gait: Unable to test due to being intubated.     Laboratory Studies:  Basic Metabolic Panel:  Recent Labs Lab 10/18/14 1958 10/23/2014 2005 11/12/2014 2012  NA 136 133* 137  K 4.0 3.6 3.5  CL 104 98* 100*  CO2 26 22  --   GLUCOSE 121* 205* 211*  BUN 13 14 16   CREATININE 0.72 0.77 0.70  CALCIUM 10.4* 10.0  --     Liver Function Tests:  Recent Labs Lab 10/18/14 1958 11/11/2014 2005  AST 29 21  ALT 32 22  ALKPHOS 125 113  BILITOT 0.9 1.2  PROT 6.7 6.6  ALBUMIN 4.0 3.5   No results for input(s): LIPASE, AMYLASE in the last 168 hours. No results for input(s): AMMONIA in the last 168 hours.  CBC:  Recent Labs Lab 10/18/14 1958 11/10/2014 2005 10/17/2014 2012  WBC 8.0 11.3*  --   NEUTROABS 6.5 8.4*  --   HGB 14.1 14.7 15.3*  HCT 39.8 42.5 45.0  MCV 91.7 91.2  --   PLT 164 200  --     Cardiac Enzymes:  Recent Labs Lab 10/18/14 1958  TROPONINI <0.03    BNP: Invalid input(s): POCBNP  CBG: No results for input(s): GLUCAP in the last 168 hours.  Microbiology: Results for orders placed or performed during the hospital encounter of 05/07/12  MRSA PCR Screening     Status: None   Collection Time: 05/08/12  6:40 AM  Result Value  Ref Range Status   MRSA by PCR NEGATIVE NEGATIVE Final    Comment:        The GeneXpert MRSA Assay (FDA approved for NASAL specimens only), is one component of a comprehensive MRSA colonization surveillance program. It is not intended to diagnose MRSA infection nor to guide or monitor treatment for MRSA infections.    Coagulation Studies:  Recent Labs  11/06/2014 2005  LABPROT 16.1*  INR 1.28    Urinalysis:   Recent Labs Lab 10/18/14 2129  COLORURINE YELLOW  LABSPEC 1.014  PHURINE 5.5  GLUCOSEU NEGATIVE  HGBUR NEGATIVE  BILIRUBINUR NEGATIVE  KETONESUR 15*  PROTEINUR NEGATIVE  UROBILINOGEN 0.2  NITRITE NEGATIVE  LEUKOCYTESUR SMALL*  Lipid Panel:    Component Value Date/Time   CHOL 183 07/29/2014 0511   TRIG 53 07/29/2014 0511   HDL 47 07/29/2014 0511   CHOLHDL 3.9 07/29/2014 0511   VLDL 11 07/29/2014 0511   LDLCALC 125* 07/29/2014 0511    HgbA1C:  Lab Results  Component Value Date   HGBA1C 4.2* 07/28/2014    Urine Drug Screen:  No results found for: LABOPIA, [place], [place], [place], THCU, LABBARB  Alcohol Level:   Recent Labs Lab 11/10/2014 2005  Melvin <5    Other results: EKG: sinus tachycardia at 131 bpm.  Imaging: Ct Head Wo Contrast  11/06/2014   CLINICAL DATA:  Code stroke, altered mental status  EXAM: CT HEAD WITHOUT CONTRAST  TECHNIQUE: Contiguous axial images were obtained from the base of the skull through the vertex without intravenous contrast.  COMPARISON:  07/28/2014  FINDINGS: Severe diffuse atrophy. Moderate low attenuation in the deep white matter. No evidence of mass or vascular territory infarct. No hemorrhage or extra-axial fluid. No hydrocephalus. Calvarium intact. Patchy areas of lucency within the calvarium may reflect osteoporosis or less likely myeloma this change. These are stable. Inflammatory change in the ethmoid air cells noted.  IMPRESSION: No acute intracranial abnormalities. Critical Value/emergent results  were called by telephone at the time of interpretation on 11/11/2014 at 8:50 pm to Dr. Doy Mince , who verbally acknowledged these results.   Electronically Signed   By: Skipper Cliche M.D.   On: 10/19/2014 20:48    Assessment: 81 y.o. female presenting poorly responsive.  Patient has had a progressive decline throughout the day.  NIHSS elevated at 23.  Patient now intubated.  Head CT personally reviewed and shows no acute changes.  Significant atrophy noted.  Although a posterior circulation event is in the differential, patient is outside of the tPA window and has a mRankin that would make intervention likely not a reasonable alternative.  Other possibilities should be considered as well including infection, etc.  Stroke Risk Factors - hypertension  Plan: 1. HgbA1c, fasting lipid panel 2. MRI, MRA  of the brain without contrast.  Would not entertain stroke work up unless imaging indication of an acute ischemic event.   3. ASA 300mg  daily per rectum 4. NPO until RN stroke swallow screen 5. Telemetry monitoring 6. Frequent neuro checks  Case discussed with Dr. Mariella Saa, MD [facility] [phone] 11/11/2014, 9:17 PM

## 2014-10-24 NOTE — Progress Notes (Signed)
ANTIBIOTIC CONSULT NOTE - INITIAL  Pharmacy Consult for unasyn Indication: rule out pneumonia  Allergies  Allergen Reactions  . Percocet [Oxycodone-Acetaminophen]     unknown    Patient Measurements: Weight: 98 lb 12.3 oz (44.8 kg) Adjusted Body Weight:   Vital Signs: Temp: 96.5 F (35.8 C) (10/10 2049) Temp Source: Temporal (10/10 1957) BP: 79/51 mmHg (10/10 2245) Pulse Rate: 97 (10/10 2230) Intake/Output from previous day:   Intake/Output from this shift: Total I/O In: 2000 [I.V.:2000] Out: -   Labs:  Recent Labs  11/01/2014 2005 10/26/2014 2012  WBC 11.3*  --   HGB 14.7 15.3*  PLT 200  --   CREATININE 0.77 0.70   Estimated Creatinine Clearance: 33.7 mL/min (by C-G formula based on Cr of 0.7). No results for input(s): VANCOTROUGH, VANCOPEAK, VANCORANDOM, GENTTROUGH, GENTPEAK, GENTRANDOM, TOBRATROUGH, TOBRAPEAK, TOBRARND, AMIKACINPEAK, AMIKACINTROU, AMIKACIN in the last 72 hours.   Microbiology: No results found for this or any previous visit (from the past 720 hour(s)).  Medical History: Past Medical History  Diagnosis Date  . Paget's bone disease   . Hypertension   . Cancer ([place])     colon  . Glaucoma   . Constipation   . Dementia     Medications:  Anti-infectives    Start     Dose/Rate Route Frequency Ordered Stop   10/21/2014 2330  ampicillin-sulbactam (UNASYN) 1.5 g in sodium chloride 0.9 % 50 mL IVPB     1.5 g 100 mL/hr over 30 Minutes Intravenous Every 6 hours 10/17/2014 2327       Assessment: 6 yof presented to the ED with AMS requiring intubation for airway protection. To start unasyn for possible aspiration pneumonia. Pt is hypothermic and WBC is mildly elevated at 11.3. Scr is WNL.   Unasyn 10/10>>  Goal of Therapy:  Eradication of infection  Plan:  - Unasyn 1.5mg  IV Q6H - F/u renal fxn, C&S, clinical status  Neda Willenbring, Rande Lawman 11/08/2014,11:28 PM

## 2014-10-24 NOTE — Progress Notes (Signed)
ABG results shown to DR. Knapp. MD states to leave patient on current settings of 450 tidal volume, +5, RR 14, FIO2 decrease to 40%.

## 2014-10-24 NOTE — ED Provider Notes (Signed)
CSN: [id number]     Arrival date & time 10/29/2014  1954 History   First MD Initiated Contact with Patient 11/06/2014 2017     Chief Complaint  Patient presents with  . Code Stroke    An emergency department physician performed an initial assessment on this suspected stroke patient at 2035 (Pt intubated, CT attempted, BP low fluids started, CT done). (Consider location/radiation/quality/duration/timing/severity/associated sxs/prior Treatment) Patient is a 81 y.o. female presenting with altered mental status. The history is provided by a caregiver, medical records and the EMS personnel. The history is limited by the condition of the patient.  Altered Mental Status Presenting symptoms: behavior changes, confusion and partial responsiveness   Severity:  Severe Most recent episode:  Today Episode history:  Continuous Duration:  6 hours Timing:  Constant Progression:  Worsening Chronicity:  New Context: dementia   Associated symptoms: no fever and no vomiting   Associated symptoms comment:  Difficulty swallowing noticed around 2:00pm by home health aide, decreasing alertness and increasing confusion, desat to 70% found by EMS   Past Medical History  Diagnosis Date  . Paget's bone disease   . Hypertension   . Cancer ([place])     colon  . Glaucoma   . Constipation   . Dementia    Past Surgical History  Procedure Laterality Date  . Hip arthroplasty     Family History  Problem Relation Age of Onset  . Hypertension Mother   . Hypertension Father    Social History  Substance Use Topics  . Smoking status: Never Smoker   . Smokeless tobacco: Never Used  . Alcohol Use: No   OB History    No data available     Review of Systems  Unable to perform ROS: Patient nonverbal  Constitutional: Negative for fever.  Gastrointestinal: Negative for vomiting.  Allergic/Immunologic: Negative for immunocompromised state.  Neurological: Positive for speech difficulty.  Psychiatric/Behavioral:  Positive for confusion.      Allergies  Percocet  Home Medications   Prior to Admission medications   Medication Sig Start Date End Date Taking? Authorizing Provider  Cholecalciferol (VITAMIN D) 2000 UNITS CAPS Take 2,000 Units by mouth daily.   Yes Historical Provider, MD  docusate sodium (COLACE) 100 MG capsule Take 100 mg by mouth daily as needed for mild constipation.   Yes Historical Provider, MD  DULoxetine (CYMBALTA) 60 MG capsule Take 60 mg by mouth 2 (two) times daily.  10/14/14  Yes Historical Provider, MD  latanoprost (XALATAN) 0.005 % ophthalmic solution Place 1 drop into both eyes every evening.    Yes Historical Provider, MD  LORazepam (ATIVAN) 0.5 MG tablet Take 0.5 mg by mouth every 6 (six) hours as needed for anxiety.  10/17/14  Yes Historical Provider, MD  metoprolol (LOPRESSOR) 50 MG tablet Take 50-100 mg by mouth 2 (two) times daily. Take 1 tablet (50 mg) in the am & Take 2 tablets (100 mg) at bedtime. 10/14/14  Yes Historical Provider, MD  Multiple Vitamin (MULTIVITAMIN WITH MINERALS) TABS Take 1 tablet by mouth every morning.    Yes Historical Provider, MD  polyethylene glycol (MIRALAX / GLYCOLAX) packet Take 17 g by mouth daily as needed for moderate constipation.    Yes Historical Provider, MD  Probiotic Product (ALIGN) 4 MG CAPS Take 1 capsule by mouth daily.   Yes Historical Provider, MD  risperiDONE (RISPERDAL) 0.5 MG tablet Take 0.5 mg by mouth daily.  10/14/14  Yes Historical Provider, MD   BP 80/53 mmHg  Pulse 99  Temp(Src) 96.5 F (35.8 C)  Resp 15  Wt 98 lb 12.3 oz (44.8 kg)  SpO2 95% Physical Exam  Constitutional: She appears distressed.  Thin, elderly  HENT:  Head: Normocephalic and atraumatic.  Eyes: Pupils are equal, round, and reactive to light. Right eye exhibits no discharge. Left eye exhibits no discharge.  Neck: No tracheal deviation present.  Cardiovascular: Regular rhythm.  Tachycardia present.   Pulmonary/Chest: She is in respiratory  distress.  bilat rhonchi. Supplemental o2 via nasal trumpet/cannula and BVM, followed by intubation  Abdominal: Soft. She exhibits no distension.  Musculoskeletal: She exhibits no edema.  Neurological: She is alert.  Moving all extremities. Not following commands. Somnolent. Withdraws to painful stimuli  Skin: Skin is warm and dry.  Psychiatric:  Confused/altered  Nursing note and vitals reviewed.   ED Course  .Intubation Performed by: Dorie Rank Authorized by: Ivin Booty Consent: The procedure was performed in an emergent situation. Patient identity confirmed: arm band and hospital-assigned identification number Time out: Immediately prior to procedure a "time out" was called to verify the correct patient, procedure, equipment, support staff and site/side marked as required. Indications: airway protection and  respiratory distress Intubation method: video-assisted Patient status: paralyzed (RSI) Preoxygenation: BVM and nonrebreather mask Tube size: 7.5 mm Tube type: cuffed Number of attempts: 2 Ventilation between attempts: BVM Cords visualized: yes Post-procedure assessment: chest rise and ETCO2 monitor Breath sounds: equal Cuff inflated: yes Tube secured with: ETT holder Chest x-ray interpreted by me, other physician and radiologist. Chest x-ray findings: endotracheal tube in appropriate position Patient tolerance: Patient tolerated the procedure well with no immediate complications   (including critical care time) Labs Review Labs Reviewed  PROTIME-INR - Abnormal; Notable for the following:    Prothrombin Time 16.1 (*)    All other components within normal limits  CBC - Abnormal; Notable for the following:    WBC 11.3 (*)    All other components within normal limits  DIFFERENTIAL - Abnormal; Notable for the following:    Neutro Abs 8.4 (*)    Monocytes Absolute 1.1 (*)    All other components within normal limits  COMPREHENSIVE METABOLIC PANEL - Abnormal;  Notable for the following:    Sodium 133 (*)    Chloride 98 (*)    Glucose, Bld 205 (*)    All other components within normal limits  I-STAT CHEM 8, ED - Abnormal; Notable for the following:    Chloride 100 (*)    Glucose, Bld 211 (*)    Calcium, Ion 1.36 (*)    Hemoglobin 15.3 (*)    All other components within normal limits  I-STAT ARTERIAL BLOOD GAS, ED - Abnormal; Notable for the following:    pH, Arterial 7.342 (*)    pO2, Arterial 339.0 (*)    Bicarbonate 24.2 (*)    All other components within normal limits  ETHANOL  APTT  URINE RAPID DRUG SCREEN, HOSP PERFORMED  URINALYSIS, ROUTINE W REFLEX MICROSCOPIC (NOT AT [facility])  TRIGLYCERIDES  BLOOD GAS, ARTERIAL  I-STAT CHEM 8, ED  I-STAT TROPOININ, ED    Imaging Review Ct Head Wo Contrast  10/26/2014   CLINICAL DATA:  Code stroke, altered mental status  EXAM: CT HEAD WITHOUT CONTRAST  TECHNIQUE: Contiguous axial images were obtained from the base of the skull through the vertex without intravenous contrast.  COMPARISON:  07/28/2014  FINDINGS: Severe diffuse atrophy. Moderate low attenuation in the deep white matter. No evidence of mass or vascular territory  infarct. No hemorrhage or extra-axial fluid. No hydrocephalus. Calvarium intact. Patchy areas of lucency within the calvarium may reflect osteoporosis or less likely myeloma this change. These are stable. Inflammatory change in the ethmoid air cells noted.  IMPRESSION: No acute intracranial abnormalities. Critical Value/emergent results were called by telephone at the time of interpretation on 10/25/2014 at 8:50 pm to Dr. Doy Mince , who verbally acknowledged these results.   Electronically Signed   By: Skipper Cliche M.D.   On: 10/20/2014 20:48   Dg Chest Portable 1 View  10/21/2014   CLINICAL DATA:  Respiratory failure.  Endotracheal tube.  EXAM: PORTABLE CHEST 1 VIEW  COMPARISON:  10/18/2014  FINDINGS: Endotracheal tube has been placed with tip measuring 3.8 cm above the carina.  The trachea is tortuous and the endotracheal tube is directed laterally towards the right within the tracheal shadow. Shallow inspiration. Heart size and pulmonary vascularity are normal. There is scarring versus consolidation and volume loss in the right upper chest. No blunting of costophrenic angles. No pneumothorax. Calcified and tortuous aorta. Coarsening of trabeculation in the proximal left humerus probably representing Paget's disease although chronic osteomyelitis could also have this appearance.  IMPRESSION: Endotracheal tube tip measures 3.8 cm above the carina. The trachea is tortuous and the endotracheal tube is directed laterally towards the right within the tracheal shadow.   Electronically Signed   By: Lucienne Capers M.D.   On: 10/21/2014 21:52   I have personally reviewed and evaluated these images and lab results as part of my medical decision-making.   EKG Interpretation   Date/Time:  Monday October 24 2014 19:59:44 EDT Ventricular Rate:  131 PR Interval:  135 QRS Duration: 84 QT Interval:  353 QTC Calculation: 521 R Axis:   49 Text Interpretation:  Sinus tachycardia Nonspecific T abnormalities,  inferior leads Prolonged QT interval Since last tracing rate faster  Confirmed by KNAPP  MD-J, JON ([id number]) on 10/28/2014 8:34:19 PM      MDM   Final diagnoses:  Encephalopathy acute    81 yo F with hx HTN, dementia, pagets disease presenting as activated code stroke for difficulty speaking/swallowing and progressive decreased LOC with home health aide, with last normal reported at 2pm. Outside of window for TPA and upon arrival feel while CVA possible, shock/encephalopathy more likely cause of condition.   Respiratory distress upon arrival, difficulty tolerating secretions, decreased LOC. Intubated as above for airway protection and respiratory support. Became progressively hypotensive after intubation, improved with fluids and on manual BP checks. Propofol started for  sedation but in setting of hypotension stopped with increase in BP after. NAICA on head CT. Admitted to ICU for further management.   Note- pt reported as having DNR order at home though resuscitation carried out as pt without paperwork or documentation in chart, unable to reach immediate family.   Case discussed with Dr. Tomi Bamberger, who oversaw management of this patient.   Ivin Booty, MD 10/25/14 [phone]

## 2014-10-24 NOTE — Progress Notes (Signed)
Pt found per home health RN at 2pm with trouble swallowing. Pt with decreased LOC throughout the day with noted foaming at the mouth, and EMS was called this evening. Per EMS Pt 02 sats 70% on arrival. Pt brought to [facility] with respirations per BVM, and intubated as Pt unable to protect her airway. Pertinent history obtained from chart includes HTN and dementia. CT delayed due to severe hypotension, fluid bolus started. CT completed and negative per Neurologist Dr. Doy Mince. NIHSS completed yielding 23. Blood sugar 211. Pt not a TPA candidate at this time as she is out of the window. For admit tonight per CCM.

## 2014-10-24 NOTE — ED Notes (Signed)
Per [facility] - pt from home w/ home health nurse present, pt w/ onset of difficulty swallowing that began approx 14:00 today, pt w/ progressively decreasing LOC and began "foaming at the mouth." On EMS arrival pt found to have SPO2 of 70% on RA, pt assisted w/ ventilations via BVM and SPO2 up to 98% - pt making voluntary movements however not responsive to verbal stimuli. Pt responsive to noxious stimuli on arrival to ED.

## 2014-10-25 ENCOUNTER — Inpatient Hospital Stay (HOSPITAL_COMMUNITY): Payer: Medicare Other

## 2014-10-25 DIAGNOSIS — R6521 Severe sepsis with septic shock: Secondary | ICD-10-CM

## 2014-10-25 DIAGNOSIS — R400 Somnolence: Secondary | ICD-10-CM

## 2014-10-25 DIAGNOSIS — I471 Supraventricular tachycardia: Secondary | ICD-10-CM

## 2014-10-25 DIAGNOSIS — J9601 Acute respiratory failure with hypoxia: Secondary | ICD-10-CM | POA: Diagnosis present

## 2014-10-25 DIAGNOSIS — A419 Sepsis, unspecified organism: Secondary | ICD-10-CM | POA: Diagnosis present

## 2014-10-25 DIAGNOSIS — J189 Pneumonia, unspecified organism: Secondary | ICD-10-CM

## 2014-10-25 DIAGNOSIS — J690 Pneumonitis due to inhalation of food and vomit: Secondary | ICD-10-CM | POA: Insufficient documentation

## 2014-10-25 LAB — MRSA PCR SCREENING: MRSA by PCR: NEGATIVE

## 2014-10-25 LAB — URINALYSIS, ROUTINE W REFLEX MICROSCOPIC
Bilirubin Urine: NEGATIVE
GLUCOSE, UA: NEGATIVE mg/dL
HGB URINE DIPSTICK: NEGATIVE
Ketones, ur: 15 mg/dL — AB
LEUKOCYTES UA: NEGATIVE
Nitrite: NEGATIVE
PH: 6 (ref 5.0–8.0)
Protein, ur: 100 mg/dL — AB
SPECIFIC GRAVITY, URINE: 1.013 (ref 1.005–1.030)
Urobilinogen, UA: 0.2 mg/dL (ref 0.0–1.0)

## 2014-10-25 LAB — CBC
HCT: 33.5 % — ABNORMAL LOW (ref 36.0–46.0)
HEMOGLOBIN: 11.7 g/dL — AB (ref 12.0–15.0)
MCH: 31.7 pg (ref 26.0–34.0)
MCHC: 34.9 g/dL (ref 30.0–36.0)
MCV: 90.8 fL (ref 78.0–100.0)
Platelets: 173 10*3/uL (ref 150–400)
RBC: 3.69 MIL/uL — ABNORMAL LOW (ref 3.87–5.11)
RDW: 12.5 % (ref 11.5–15.5)
WBC: 14 10*3/uL — AB (ref 4.0–10.5)

## 2014-10-25 LAB — PROCALCITONIN: Procalcitonin: 0.2 ng/mL

## 2014-10-25 LAB — BASIC METABOLIC PANEL
ANION GAP: 8 (ref 5–15)
BUN: 13 mg/dL (ref 6–20)
CALCIUM: 8.4 mg/dL — AB (ref 8.9–10.3)
CO2: 20 mmol/L — ABNORMAL LOW (ref 22–32)
Chloride: 107 mmol/L (ref 101–111)
Creatinine, Ser: 0.61 mg/dL (ref 0.44–1.00)
GFR calc Af Amer: >60 mL/min (ref >60)
GLUCOSE: 120 mg/dL — AB (ref 65–99)
Potassium: 2.9 mmol/L — ABNORMAL LOW (ref 3.5–5.1)
SODIUM: 135 mmol/L (ref 135–145)

## 2014-10-25 LAB — TROPONIN I
TROPONIN I: 0.06 ng/mL — AB (ref <0.031)
TROPONIN I: 0.06 ng/mL — AB (ref <0.031)
TROPONIN I: 0.06 ng/mL — AB (ref <0.031)

## 2014-10-25 LAB — OSMOLALITY: OSMOLALITY: 293 mosm/kg (ref 275–300)

## 2014-10-25 LAB — GLUCOSE, CAPILLARY
GLUCOSE-CAPILLARY: 107 mg/dL — AB (ref 65–99)
GLUCOSE-CAPILLARY: 130 mg/dL — AB (ref 65–99)
GLUCOSE-CAPILLARY: 139 mg/dL — AB (ref 65–99)
Glucose-Capillary: 117 mg/dL — ABNORMAL HIGH (ref 65–99)
Glucose-Capillary: 138 mg/dL — ABNORMAL HIGH (ref 65–99)
Glucose-Capillary: 93 mg/dL (ref 65–99)

## 2014-10-25 LAB — PHOSPHORUS: PHOSPHORUS: 2 mg/dL — AB (ref 2.5–4.6)

## 2014-10-25 LAB — RAPID URINE DRUG SCREEN, HOSP PERFORMED
AMPHETAMINES: NOT DETECTED
BENZODIAZEPINES: POSITIVE — AB
Barbiturates: NOT DETECTED
COCAINE: NOT DETECTED
OPIATES: NOT DETECTED
Tetrahydrocannabinol: NOT DETECTED

## 2014-10-25 LAB — URINE MICROSCOPIC-ADD ON

## 2014-10-25 LAB — TSH: TSH: 0.64 u[IU]/mL (ref 0.350–4.500)

## 2014-10-25 LAB — CORTISOL: Cortisol, Plasma: 20 ug/dL

## 2014-10-25 LAB — OSMOLALITY, URINE: Osmolality, Ur: 407 mOsm/kg (ref 390–1090)

## 2014-10-25 LAB — LACTIC ACID, PLASMA: LACTIC ACID, VENOUS: 2.3 mmol/L — AB (ref 0.5–2.0)

## 2014-10-25 LAB — MAGNESIUM: MAGNESIUM: 1.2 mg/dL — AB (ref 1.7–2.4)

## 2014-10-25 LAB — TRIGLYCERIDES: Triglycerides: 59 mg/dL (ref <150)

## 2014-10-25 MED ORDER — ANTISEPTIC ORAL RINSE SOLUTION (CORINZ)
7.0000 mL | Freq: Four times a day (QID) | OROMUCOSAL | Status: DC
  Administered 2014-10-25 – 2014-10-26 (×6): 7 mL via OROMUCOSAL

## 2014-10-25 MED ORDER — POTASSIUM CHLORIDE 10 MEQ/100ML IV SOLN
10.0000 meq | INTRAVENOUS | Status: AC
  Administered 2014-10-25 (×4): 10 meq via INTRAVENOUS
  Filled 2014-10-25 (×4): qty 100

## 2014-10-25 MED ORDER — FENTANYL CITRATE (PF) 100 MCG/2ML IJ SOLN
25.0000 ug | INTRAMUSCULAR | Status: DC | PRN
  Administered 2014-10-26: 50 ug via INTRAVENOUS
  Administered 2014-10-26 (×2): 25 ug via INTRAVENOUS
  Administered 2014-10-26: 50 ug via INTRAVENOUS
  Filled 2014-10-25 (×5): qty 2

## 2014-10-25 MED ORDER — DEXMEDETOMIDINE HCL IN NACL 200 MCG/50ML IV SOLN
0.4000 ug/kg/h | INTRAVENOUS | Status: DC
  Administered 2014-10-25: 0.5 ug/kg/h via INTRAVENOUS
  Administered 2014-10-25: 0.6 ug/kg/h via INTRAVENOUS
  Administered 2014-10-25: 0.4 ug/kg/h via INTRAVENOUS
  Administered 2014-10-26: 0.5 ug/kg/h via INTRAVENOUS
  Filled 2014-10-25 (×4): qty 50

## 2014-10-25 MED ORDER — SODIUM CHLORIDE 0.9 % IV BOLUS (SEPSIS)
250.0000 mL | Freq: Once | INTRAVENOUS | Status: AC
  Administered 2014-10-25: 250 mL via INTRAVENOUS

## 2014-10-25 MED ORDER — DEXTROSE 5 % IV SOLN
2.0000 ug/min | INTRAVENOUS | Status: DC
  Filled 2014-10-25: qty 4

## 2014-10-25 MED ORDER — CHLORHEXIDINE GLUCONATE 0.12% ORAL RINSE (MEDLINE KIT)
15.0000 mL | Freq: Two times a day (BID) | OROMUCOSAL | Status: DC
  Administered 2014-10-25 – 2014-10-26 (×3): 15 mL via OROMUCOSAL

## 2014-10-25 MED ORDER — MAGNESIUM SULFATE 50 % IJ SOLN
3.0000 g | Freq: Once | INTRAVENOUS | Status: AC
  Administered 2014-10-25: 3 g via INTRAVENOUS
  Filled 2014-10-25: qty 6

## 2014-10-25 MED ORDER — INSULIN ASPART 100 UNIT/ML ~~LOC~~ SOLN
2.0000 [IU] | SUBCUTANEOUS | Status: DC
  Administered 2014-10-25 – 2014-10-26 (×4): 2 [IU] via SUBCUTANEOUS

## 2014-10-25 MED ORDER — SODIUM CHLORIDE 0.9 % IV BOLUS (SEPSIS)
1000.0000 mL | Freq: Once | INTRAVENOUS | Status: AC
  Administered 2014-10-25: 1000 mL via INTRAVENOUS

## 2014-10-25 NOTE — Care Management Note (Signed)
Case Management Note  Patient Details  Name: [name] MRN: [id number] Date of Birth: [birth year]  Subjective/Objective:   Pt admitted on 10/23/2014 with difficulty swallowing, AMS, respiratory failure.  PTA, pt resided at home with spouse.  She does have visiting home health nurses in the home.                   Action/Plan: Pt currently remains sedated and on ventilator.  Will follow for discharge planning as pt progresses.    Expected Discharge Date:                  Expected Discharge Plan:  [place]  In-House Referral:     Discharge planning Services  CM Consult  Post Acute Care Choice:    Choice offered to:     DME Arranged:    DME Agency:     HH Arranged:    [place] Agency:     Status of Service:  In process, will continue to follow  Medicare Important Message Given:    Date Medicare IM Given:    Medicare IM give by:    Date Additional Medicare IM Given:    Additional Medicare Important Message give by:     If discussed at [place] of Stay Meetings, dates discussed:    Additional Comments:  Reinaldo Raddle, RN, BSN  Trauma/Neuro ICU Case Manager [phone]

## 2014-10-25 NOTE — Progress Notes (Signed)
[place] Progress Note Patient Name: [name] DOB: 10-01-1922 MRN: [id number]   Date of Service  10/25/2014  HPI/Events of Note  Hypotension - BP = 51/38. Already on Phenylephrine IV infusion at 200 mcg/min.  eICU Interventions  Will order: 1. Called the NP to place central venous line.  2. Monitor CVP when central line placed.  3. Norepinephrine IV infusion.  4. Wean Phenylephrine IV infusion off as tolerated when Norepinephrine IV infusion is up and running.      Intervention Category Major Interventions: Hypotension - evaluation and management  Sommer,Steven Eugene 10/25/2014, 1:38 AM

## 2014-10-25 NOTE — Progress Notes (Signed)
   10/25/14 0115  Vitals  BP (!) 54/32 mmHg  MAP (mmHg) 38  Pulse Rate 78  ECG Heart Rate 78  Resp 12  Oxygen Therapy  SpO2 100 %   Patient became hypotensive, Phenylephrine started and titrated up to 200 mcg with minimal response. [place] notified. Shortly after infusing the pressor through R AC PIV the site infiltrated, the IV was removed and the area appears red.   Gina Housekeeper NP at the bedside.

## 2014-10-25 NOTE — Progress Notes (Signed)
PCCM INTERVAL PROGRESS NOTE  Called to bedside by [facility] MD to evaluate hypotension. In the ED she was transiently hypotensive in ED which responded well to IV fluids. She had order for phenylephrine, however, this was not used until she reached the ICU. It did maintain her BP for some time, but she had a precipitous drop in SBP into the 60's despite 237mcg of neo. It was at this time that I arrived to bedside. Levophed had been ordered by [facility] MD. I contacted to patient's daughter Cecelia to consent her for central line placement. She noted that patient was DNR before this admission with limited functional capacity at baseline and she did not want to put her mother through any invasive procedures, but wanted to continue full medical care at this time. I outlined the risks of infusing vasoactive medications through a peripheral IV and she voiced an understanding. Shortly after that, bedside RN notified me that the IV which was infusing neo had infiltrated (no levo running at that time). Neo was switched to PIV in LUE and BP rapidly improved. Neo was then able to be titrated down. RUE infiltrated area appears red at this time. RN will continue to monitor, and if any change will administer antidote.   Georgann Housekeeper, AGACNP-BC [facility] Pulmonology/Critical Care Pager [phone] or [phone]  10/25/2014 1:57 AM

## 2014-10-25 NOTE — Progress Notes (Signed)
PULMONARY / CRITICAL CARE MEDICINE   Name: [name] MRN: [id number] DOB: Feb 17, 1922    ADMISSION DATE:  11/10/2014 CONSULTATION DATE:  10/10  REFERRING MD :  EDP  CHIEF COMPLAINT:  Altered mental status  INITIAL PRESENTATION: 81 y/o F with h/o HTN, dementia, paget's bone disease with difficulty swallowing and progressively worsening mental status this afternoon.  Intubated in ED for SpO2 in 70s and airway protection.  STUDIES:  CXR (10/10): tortuous trachea, ETT 3.8cm above carina CT Head (10/10): No acute intracranial abnormalities ABG (10/10): 7.342 / 44.1 / 339 / 24.2 EKG (10/10): Sinus tachycardia, nonspecific T wave abnormalities inferior leads  SIGNIFICANT EVENTS: 10/10 - intubation, admit to ICU  SUBJECTIVE: Patient had peripheral IV infiltrate with Neo-Synephrine infusion overnight. No other acute events. Intermittently having supraventricular tachycardia with increasing agitation.  REVIEW OF SYSTEMS:  Unable to obtain as patient is intubated and has baseline dementia.  VITAL SIGNS: Temp:  [96.5 F (35.8 C)-99 F (37.2 C)] 98.9 F (37.2 C) (10/11 0724) Pulse Rate:  [34-138] 91 (10/11 0900) Resp:  [9-35] 25 (10/11 0900) BP: (37-205)/(27-124) 147/98 mmHg (10/11 0900) SpO2:  [93 %-100 %] 96 % (10/11 0900) FiO2 (%):  [40 %-100 %] 40 % (10/11 0900) Weight:  [98 lb 12.3 oz (44.8 kg)-100 lb 5 oz (45.5 kg)] 100 lb 5 oz (45.5 kg) (10/11 0008) HEMODYNAMICS:   VENTILATOR SETTINGS: Vent Mode:  [-] PRVC FiO2 (%):  [40 %-100 %] 40 % Set Rate:  [14 bmp] 14 bmp Vt Set:  [450 mL] 450 mL PEEP:  [5 cmH20] 5 cmH20 Plateau Pressure:  [15 cmH20-22 cmH20] 18 cmH20 INTAKE / OUTPUT:  Intake/Output Summary (Last 24 hours) at 10/25/14 0911 Last data filed at 10/25/14 0900  Gross per 24 hour  Intake 4589.8 ml  Output    220 ml  Net 4369.8 ml    PHYSICAL EXAMINATION: General:  Patient agitated. Laying in bed on ventilator attempting to push off her covers.  Integument:   Warm & dry. No rash on exposed skin. No bruising. HEENT:  Endotracheal tube in place. No scleral icterus. Cardiovascular:  Tachycardic. No edema. No appreciable JVD.  Pulmonary:  Coarse breath sounds bilaterally. Symmetric chest wall rise on ventilator. Abdomen: Soft. Normal bowel sounds. Nondistended.  Neurological: Patient is not following commands. Doesn't open eyes on command. Spontaneously moving all 4 extremities equally.  LABS:  CBC  Recent Labs Lab 10/18/14 1958 11/05/2014 2005 10/28/2014 2012 10/25/14 0545  WBC 8.0 11.3*  --  14.0*  HGB 14.1 14.7 15.3* 11.7*  HCT 39.8 42.5 45.0 33.5*  PLT 164 200  --  173   Coag's  Recent Labs Lab 11/14/2014 2005  APTT 33  INR 1.28   BMET  Recent Labs Lab 10/18/14 1958 11/06/2014 2005 11/05/2014 2012 10/25/14 0545  NA 136 133* 137 135  K 4.0 3.6 3.5 2.9*  CL 104 98* 100* 107  CO2 26 22  --  20*  BUN 13 14 16 13   CREATININE 0.72 0.77 0.70 0.61  GLUCOSE 121* 205* 211* 120*   Electrolytes  Recent Labs Lab 10/18/14 1958 10/25/2014 2005 10/25/14 0545  CALCIUM 10.4* 10.0 8.4*  MG  --   --  1.2*  PHOS  --   --  2.0*   Sepsis Markers  Recent Labs Lab 10/25/14 0012 10/25/14 0014  LATICACIDVEN 2.3*  --   PROCALCITON  --  0.20   ABG  Recent Labs Lab 10/21/2014 2100  PHART 7.342*  PCO2ART 44.1  PO2ART 339.0*   Liver Enzymes  Recent Labs Lab 10/18/14 1958 11/05/2014 2005  AST 29 21  ALT 32 22  ALKPHOS 125 113  BILITOT 0.9 1.2  ALBUMIN 4.0 3.5   Cardiac Enzymes  Recent Labs Lab 10/18/14 1958 10/25/14 0014 10/25/14 0545  TROPONINI <0.03 0.06* 0.06*   Glucose  Recent Labs Lab 10/25/14 0058 10/25/14 0457 10/25/14 0809  GLUCAP 139* 107* 117*    Imaging Ct Head Wo Contrast  11/04/2014   CLINICAL DATA:  Code stroke, altered mental status  EXAM: CT HEAD WITHOUT CONTRAST  TECHNIQUE: Contiguous axial images were obtained from the base of the skull through the vertex without intravenous contrast.   COMPARISON:  07/28/2014  FINDINGS: Severe diffuse atrophy. Moderate low attenuation in the deep white matter. No evidence of mass or vascular territory infarct. No hemorrhage or extra-axial fluid. No hydrocephalus. Calvarium intact. Patchy areas of lucency within the calvarium may reflect osteoporosis or less likely myeloma this change. These are stable. Inflammatory change in the ethmoid air cells noted.  IMPRESSION: No acute intracranial abnormalities. Critical Value/emergent results were called by telephone at the time of interpretation on 11/13/2014 at 8:50 pm to Dr. Doy Mince , who verbally acknowledged these results.   Electronically Signed   By: Skipper Cliche M.D.   On: 11/08/2014 20:48   Dg Chest Port 1 View  10/25/2014   CLINICAL DATA:  Endotracheal tube placement  EXAM: PORTABLE CHEST 1 VIEW  COMPARISON:  11/03/2014  FINDINGS: No change in position of endotracheal tube about 3.4 cm above the carina. Heart size and vascular pattern are normal. There is new right lower lobe consolidation. Aortic calcification is stable. Bilateral perihilar bronchiectasis is stable. Severe arthropathy left shoulder stable.  IMPRESSION: Endotracheal tube tip about 3.4 cm above the carina.  New right lower lobe consolidation.   Electronically Signed   By: Skipper Cliche M.D.   On: 10/25/2014 08:17   Dg Chest Portable 1 View  10/23/2014   CLINICAL DATA:  Respiratory failure.  Endotracheal tube.  EXAM: PORTABLE CHEST 1 VIEW  COMPARISON:  10/18/2014  FINDINGS: Endotracheal tube has been placed with tip measuring 3.8 cm above the carina. The trachea is tortuous and the endotracheal tube is directed laterally towards the right within the tracheal shadow. Shallow inspiration. Heart size and pulmonary vascularity are normal. There is scarring versus consolidation and volume loss in the right upper chest. No blunting of costophrenic angles. No pneumothorax. Calcified and tortuous aorta. Coarsening of trabeculation in the  proximal left humerus probably representing Paget's disease although chronic osteomyelitis could also have this appearance.  IMPRESSION: Endotracheal tube tip measures 3.8 cm above the carina. The trachea is tortuous and the endotracheal tube is directed laterally towards the right within the tracheal shadow.   Electronically Signed   By: Lucienne Capers M.D.   On: 10/15/2014 21:52     ASSESSMENT / PLAN:  PULMONARY OETT 10/10 >> A:  Acute hypoxic respiratory failure - likely secondary to aspiration/aspiration pneumonia  P:   Vent bundle Continuing short course of intubation See ID section  CARDIOVASCULAR A:   HTN Septic Shock SVT RUE infiltration phenylephrine  P:  Wean pressors for MAP>65 Monitor patient on telemetry Follow R AC skin for evidence possible necrosis  Continue to defer CVC placement per the family's wishes Hold home antihypertensives  Replacing electrolytes   RENAL A:   Hypokalemia - Replacing IV  P:   Continue to monitor urine output with Foley catheter Trending electrolytes daily  Continuing to monitor renal function with daily BUN/creatinine Replacing potassium chloride IV  GASTROINTESTINAL A:   Probable Aspiration  P:   Pepcid via tube bid Continue to hold tube feedings  HEMATOLOGIC A:  Leukocytosis - worsening Anemia - mild. No signs of active bleeding  P:  Trending Hgb & Leukocyte count daily w/ CBC Lovenox for DVT ppx  SCDs  INFECTIOUS A:   Aspiration pneumonia RLL  Possible UTI   P:   BCx x2 10/10 >> UC 10/10 >> Sputum 10/10 >>  Abx:  Unasyn, start date 10/10, day 1/7  ENDOCRINE A:   Hyperglycemia - no history of DM  P:   Accuchecks q4hr Low dose SSI  NEUROLOGIC A:   Acute encephalopathy - Likely secondary to aspiration/infection H/O Dementia   P:   RASS goal: 0 Neurology following D/C MRI/MRA brain Fentanyl/versed prn Starting Precedex gtt  FAMILY  - Updates:  Patient's current clinical state  discussed with her daughter via phone this morning. - Inter-disciplinary family meet or Palliative Care meeting due by:  10/17  TODAY'S SUMMARY: 81 year old female with underlying dementia. Patient intubated for airway protection to undergo CT scan given acute encephalopathy/altered mental status. Patient appears grossly nonfocal today and is moving all 4 extremities. I had a lengthy discussion with the patient's*via phone. It appears as though the patient has a right lower lobe pneumonia that slightly secondary to aspiration. Her daughter reports that her mental status has progressively been worsening and she has had repeated hospitalizations since July. I explained that she will likely continue to further deteriorate from her underlying dementia. We'll continue to support the patient and plan for possible extubation later this week with the goal of transitioning the patient to home to be with family for her remaining days. She remains DO NOT RESUSCITATE.   I have spent a total of 36 minutes of critical care time this morning caring for this patient, discussing the plan of care with her daughter, & reviewing the patient's electronic medical record.  Sonia Baller Ashok Cordia, M.D. [facility] Pulmonary & Critical Care Pager:  [phone] After 3pm or if no response, call [phone] 10/25/2014, 9:11 AM

## 2014-10-25 NOTE — Progress Notes (Addendum)
Subjective: Remains intubated, on sedation. Hypotensive overnight.   Objective: Current vital signs: BP 147/98 mmHg  Pulse 91  Temp(Src) 98.9 F (37.2 C) (Axillary)  Resp 25  Ht 5' (1.524 m)  Wt 45.5 kg (100 lb 5 oz)  BMI 19.59 kg/m2  SpO2 96% Vital signs in last 24 hours: Temp:  [96.5 F (35.8 C)-99 F (37.2 C)] 98.9 F (37.2 C) (10/11 0724) Pulse Rate:  [34-138] 91 (10/11 0900) Resp:  [9-35] 25 (10/11 0900) BP: (37-205)/(27-124) 147/98 mmHg (10/11 0900) SpO2:  [93 %-100 %] 96 % (10/11 0900) FiO2 (%):  [40 %-100 %] 40 % (10/11 0900) Weight:  [44.8 kg (98 lb 12.3 oz)-45.5 kg (100 lb 5 oz)] 45.5 kg (100 lb 5 oz) (10/11 0008)  Intake/Output from previous day: 10/10 0701 - 10/11 0700 In: 4199.8 [I.V.:3099.8; IV Piggyback:1100] Out: 180 [Urine:180] Intake/Output this shift: Total I/O In: 390 [I.V.:290; IV Piggyback:100] Out: 40 [Urine:40] Nutritional status: Diet NPO time specified  Neurologic Exam: Mental Status: Intubated, on sedation. Does not follow commands.  Cranial Nerves: II: Pupils pinpoint and poorly reactive III,IV, VI: ptosis not present, patient looks in all visual fields but does not appear to fix.  V,VII: corneals intact bilaterally VIII: unable to test IX,X: unable to test XI: unable to test XII: unable to test Motor: Moving all extremities spontaneously but not to command. Appears to move R side > Left though not consistent  Sensory: Withdraws to noxious stimuli throughout though less briskly on the left side Deep Tendon Reflexes: Absent throughout Plantars: Right: muteLeft: mute Cerebellar: Unable to test due to ability to follow commands Gait: Unable to test due to being intubated.    Lab Results: Basic Metabolic Panel:  Recent Labs Lab 10/18/14 1958 10/29/2014 2005 11/07/2014 2012 10/25/14 0545  NA 136 133* 137 135  K 4.0 3.6 3.5 2.9*  CL 104 98* 100* 107  CO2 26 22  --  20*  GLUCOSE 121* 205* 211*  120*  BUN 13 14 16 13   CREATININE 0.72 0.77 0.70 0.61  CALCIUM 10.4* 10.0  --  8.4*  MG  --   --   --  1.2*  PHOS  --   --   --  2.0*    Liver Function Tests:  Recent Labs Lab 10/18/14 1958 11/03/2014 2005  AST 29 21  ALT 32 22  ALKPHOS 125 113  BILITOT 0.9 1.2  PROT 6.7 6.6  ALBUMIN 4.0 3.5   No results for input(s): LIPASE, AMYLASE in the last 168 hours. No results for input(s): AMMONIA in the last 168 hours.  CBC:  Recent Labs Lab 10/18/14 1958 11/09/2014 2005 10/19/2014 2012 10/25/14 0545  WBC 8.0 11.3*  --  14.0*  NEUTROABS 6.5 8.4*  --   --   HGB 14.1 14.7 15.3* 11.7*  HCT 39.8 42.5 45.0 33.5*  MCV 91.7 91.2  --  90.8  PLT 164 200  --  173    Cardiac Enzymes:  Recent Labs Lab 10/18/14 1958 10/25/14 0014 10/25/14 0545  TROPONINI <0.03 0.06* 0.06*    Lipid Panel:  Recent Labs Lab 10/25/14 0014  TRIG 59    CBG:  Recent Labs Lab 10/25/14 0058 10/25/14 0457 10/25/14 0809  GLUCAP 139* 107* 117*    Microbiology: Results for orders placed or performed during the hospital encounter of 10/26/2014  MRSA PCR Screening     Status: None   Collection Time: 10/25/14 12:27 AM  Result Value Ref Range Status   MRSA by PCR  NEGATIVE NEGATIVE Final    Comment:        The GeneXpert MRSA Assay (FDA approved for NASAL specimens only), is one component of a comprehensive MRSA colonization surveillance program. It is not intended to diagnose MRSA infection nor to guide or monitor treatment for MRSA infections.     Coagulation Studies:  Recent Labs  10/31/2014 2005  LABPROT 16.1*  INR 1.28    Imaging: Ct Head Wo Contrast  11/07/2014   CLINICAL DATA:  Code stroke, altered mental status  EXAM: CT HEAD WITHOUT CONTRAST  TECHNIQUE: Contiguous axial images were obtained from the base of the skull through the vertex without intravenous contrast.  COMPARISON:  07/28/2014  FINDINGS: Severe diffuse atrophy. Moderate low attenuation in the deep white matter.  No evidence of mass or vascular territory infarct. No hemorrhage or extra-axial fluid. No hydrocephalus. Calvarium intact. Patchy areas of lucency within the calvarium may reflect osteoporosis or less likely myeloma this change. These are stable. Inflammatory change in the ethmoid air cells noted.  IMPRESSION: No acute intracranial abnormalities. Critical Value/emergent results were called by telephone at the time of interpretation on 11/06/2014 at 8:50 pm to Dr. Doy Mince , who verbally acknowledged these results.   Electronically Signed   By: Skipper Cliche M.D.   On: 11/06/2014 20:48   Dg Chest Port 1 View  10/25/2014   CLINICAL DATA:  Endotracheal tube placement  EXAM: PORTABLE CHEST 1 VIEW  COMPARISON:  11/14/2014  FINDINGS: No change in position of endotracheal tube about 3.4 cm above the carina. Heart size and vascular pattern are normal. There is new right lower lobe consolidation. Aortic calcification is stable. Bilateral perihilar bronchiectasis is stable. Severe arthropathy left shoulder stable.  IMPRESSION: Endotracheal tube tip about 3.4 cm above the carina.  New right lower lobe consolidation.   Electronically Signed   By: Skipper Cliche M.D.   On: 10/25/2014 08:17   Dg Chest Portable 1 View  10/19/2014   CLINICAL DATA:  Respiratory failure.  Endotracheal tube.  EXAM: PORTABLE CHEST 1 VIEW  COMPARISON:  10/18/2014  FINDINGS: Endotracheal tube has been placed with tip measuring 3.8 cm above the carina. The trachea is tortuous and the endotracheal tube is directed laterally towards the right within the tracheal shadow. Shallow inspiration. Heart size and pulmonary vascularity are normal. There is scarring versus consolidation and volume loss in the right upper chest. No blunting of costophrenic angles. No pneumothorax. Calcified and tortuous aorta. Coarsening of trabeculation in the proximal left humerus probably representing Paget's disease although chronic osteomyelitis could also have this  appearance.  IMPRESSION: Endotracheal tube tip measures 3.8 cm above the carina. The trachea is tortuous and the endotracheal tube is directed laterally towards the right within the tracheal shadow.   Electronically Signed   By: Lucienne Capers M.D.   On: 10/28/2014 21:52    Medications:  Scheduled: . ampicillin-sulbactam (UNASYN) IV  1.5 g Intravenous Q6H  . antiseptic oral rinse  7 mL Mouth Rinse QID  . chlorhexidine gluconate  15 mL Mouth Rinse BID  . enoxaparin (LOVENOX) injection  30 mg Subcutaneous Q24H  . famotidine  20 mg Per Tube BID  . insulin aspart  2-6 Units Subcutaneous 6 times per day  . magnesium sulfate 1 - 4 g bolus IVPB  3 g Intravenous Once  . potassium chloride  10 mEq Intravenous Q1 Hr x 4    Assessment/Plan:  81 y.o. female presenting poorly responsive. Patient has had a progressive decline throughout  the day. NIHSS elevated at 23 in the ED. Patient now intubated. Head CT reviewed and shows no acute changes. Differential includes infectious etiology vs less likely posterior circulation infarct. Was outside tPA window and not a IR candidate upon presentation to ED.    CCM discussed goals of care with family. Will discontinue MRI/A per family wishes at this time. Will defer further workup and plan to CCM. Will follow along as needed.     LOS: 1 day   Jim Like, DO [facility] [phone]  If 7pm- 7am, please page neurology on call as listed in [place]. 10/25/2014  9:25 AM

## 2014-10-25 NOTE — Progress Notes (Signed)
CRITICAL VALUE ALERT  Critical value received:  0107  Date of notification:  10/25/14  Time of notification: 0107  Critical value read back:Yes.    Nurse who received alert:  Rochanda Harpham RN  MD notified (1st page):  Elink   Time of first page:  0110

## 2014-10-25 NOTE — Progress Notes (Signed)
Patient transported to CT. Had to return to unit because drop in BP.

## 2014-10-25 NOTE — ED Provider Notes (Signed)
Pt presented to the ED with concerns of possible stroke, altered mental status.  Pt unable to provide any history.  Exact timeframe when she was last known normal is unclear.  Initially presented as code stroke. Physical Exam  BP 85/62 mmHg  Pulse 77  Temp(Src) 99.3 F (37.4 C) (Oral)  Resp 14  Ht 5' (1.524 m)  Wt 100 lb 5 oz (45.5 kg)  BMI 19.59 kg/m2  SpO2 100%  Physical Exam  Constitutional: She has a sickly appearance. She appears distressed. Face mask in place.  HENT:  Head: Normocephalic and atraumatic.  Right Ear: External ear normal.  Left Ear: External ear normal.  Copious secretions in the OP  Eyes: Conjunctivae are normal. Right eye exhibits no discharge. Left eye exhibits no discharge. No scleral icterus.  Neck: Neck supple. No tracheal deviation present.  Cardiovascular: Normal rate.   Pulmonary/Chest: No stridor.  Gurgling type sounds on respiration  Abdominal: She exhibits no distension. There is no tenderness. There is no rebound and no guarding.  Musculoskeletal: She exhibits no edema.  Neurological: She is unresponsive. Cranial nerve deficit: no facial droop noted. GCS eye subscore is 1. GCS verbal subscore is 1. GCS motor subscore is 5.  All 4 extremities move in response to stimuli  Skin: Skin is warm and dry. No rash noted.  Psychiatric: She has a normal mood and affect.  Nursing note and vitals reviewed.   ED Course  .Intubation Date/Time: 10/25/2014 1:40 PM Performed by: Dorie Rank Authorized by: Dorie Rank Consent: The procedure was performed in an emergent situation. Indications: airway protection Intubation method: video-assisted Patient status: paralyzed (RSI) Preoxygenation: nonrebreather mask Sedatives: etomidate Paralytic: rocuronium Tube size: 7.5 mm Tube type: cuffed Number of attempts: 3 Cords visualized: yes Post-procedure assessment: chest rise and ETCO2 monitor Breath sounds: equal Cuff inflated: yes Comments: Initial 2 attempts  by Dr Gelene Mink.  Oxygen saturation remained >95%.  I performed the procedure with Glidescope without difficulty.  CRITICAL CARE Performed by: WIOMB,TDH Total critical care time: 35 Critical care time was exclusive of separately billable procedures and treating other patients. Critical care was necessary to treat or prevent imminent or life-threatening deterioration. Critical care was time spent personally by me on the following activities: development of treatment plan with patient and/or surrogate as well as nursing, discussions with consultants, evaluation of patient's response to treatment, examination of patient, obtaining history from patient or surrogate, ordering and performing treatments and interventions, ordering and review of laboratory studies, ordering and review of radiographic studies, pulse oximetry and re-evaluation of patient's condition.  EKG Interpretation  Date/Time:  Monday October 24 2014 19:59:44 EDT Ventricular Rate:  131 PR Interval:  135 QRS Duration: 84 QT Interval:  353 QTC Calculation: 521 R Axis:   49 Text Interpretation:  Sinus tachycardia Nonspecific T abnormalities, inferior leads Prolonged QT interval Since last tracing rate faster Confirmed by Blue Winther  MD-J, Jaheim Canino ([id number]) on 10/21/2014 8:34:19 PM      MDM CT scan did not show an acute stroke.  Not a TPA candidate.  Seen by Dr Doy Mince upon her arrival to the ED. Sx still concerning for stroke considering her history and presentation.  Pt admitted to the ICU service for further treatment and evaluation.     Dorie Rank, MD 10/25/14 1345

## 2014-10-25 NOTE — Care Management Note (Signed)
Case Management Note  Patient Details  Name: [name] MRN: [id number] Date of Birth: [birth year]  Subjective/Objective:                    Action/Plan:  Initial review completed.  Expected Discharge Date:                  Expected Discharge Plan:     In-House Referral:     Discharge planning Services     Post Acute Care Choice:    Choice offered to:     DME Arranged:    DME Agency:     HH Arranged:    HH Agency:     Status of Service:  In process, will continue to follow  Medicare Important Message Given:    Date Medicare IM Given:    Medicare IM give by:    Date Additional Medicare IM Given:    Additional Medicare Important Message give by:     If discussed at [place] of Stay Meetings, dates discussed:    Additional Comments:  Marilu Favre, RN 10/25/2014, 7:40 AM

## 2014-10-26 DIAGNOSIS — R401 Stupor: Secondary | ICD-10-CM

## 2014-10-26 LAB — RENAL FUNCTION PANEL
ALBUMIN: 2.4 g/dL — AB (ref 3.5–5.0)
Anion gap: 12 (ref 5–15)
BUN: 10 mg/dL (ref 6–20)
CO2: 16 mmol/L — AB (ref 22–32)
Calcium: 9.3 mg/dL (ref 8.9–10.3)
Chloride: 109 mmol/L (ref 101–111)
Creatinine, Ser: 0.61 mg/dL (ref 0.44–1.00)
GFR calc Af Amer: >60 mL/min (ref >60)
GFR calc non Af Amer: >60 mL/min (ref >60)
GLUCOSE: 121 mg/dL — AB (ref 65–99)
PHOSPHORUS: 1.8 mg/dL — AB (ref 2.5–4.6)
POTASSIUM: 3.8 mmol/L (ref 3.5–5.1)
Sodium: 137 mmol/L (ref 135–145)

## 2014-10-26 LAB — MAGNESIUM: MAGNESIUM: 1.7 mg/dL (ref 1.7–2.4)

## 2014-10-26 LAB — GLUCOSE, CAPILLARY
GLUCOSE-CAPILLARY: 100 mg/dL — AB (ref 65–99)
GLUCOSE-CAPILLARY: 103 mg/dL — AB (ref 65–99)
GLUCOSE-CAPILLARY: 137 mg/dL — AB (ref 65–99)
Glucose-Capillary: 115 mg/dL — ABNORMAL HIGH (ref 65–99)

## 2014-10-26 LAB — URINE CULTURE: CULTURE: NO GROWTH

## 2014-10-26 MED ORDER — LORAZEPAM 2 MG/ML IJ SOLN
1.0000 mg | INTRAMUSCULAR | Status: DC | PRN
  Administered 2014-10-26: 2 mg via INTRAVENOUS
  Filled 2014-10-26: qty 1

## 2014-10-26 MED ORDER — SCOPOLAMINE 1 MG/3DAYS TD PT72
1.0000 | MEDICATED_PATCH | TRANSDERMAL | Status: DC
  Administered 2014-10-26: 1.5 mg via TRANSDERMAL
  Filled 2014-10-26: qty 1

## 2014-10-26 MED ORDER — MORPHINE SULFATE (PF) 2 MG/ML IV SOLN
2.0000 mg | INTRAVENOUS | Status: DC | PRN
Start: 2014-10-26 — End: 2014-10-27
  Administered 2014-10-26: 2 mg via INTRAVENOUS
  Administered 2014-10-26: 4 mg via INTRAVENOUS
  Filled 2014-10-26: qty 1
  Filled 2014-10-26: qty 2

## 2014-10-26 NOTE — Progress Notes (Signed)
Called by RN for update on family arrival.  This is an 81yo female with hx dementia admitted 10/10 with AMS and acute respiratory failure r/t aspiration PNA +/- UTI.  Pt's daughter now at bedside [place].  Discussions with Dr. Ashok Cordia from this am reviewed.  Daughter is ready to proceed with extubation for comfort, no reintubation.  Pt currently comfortable with PRN fentanyl, off pressors at this time.    PLAN -  Will place orders for withdrawal of care    Add PRN morphine - transition to morphine gtt if s/s discomfort or dyspnea  Ativan PRN No further labs, xrays, abx, SSI If remains stable post extubation, ok for tx to palliative care floor  SW, chaplain consults placed    Nickolas Madrid, NP 10/26/2014  4:15 PM Pager: [phone] or [phone]

## 2014-10-26 NOTE — Progress Notes (Signed)
[place] Progress Note Patient Name: AZLYN WINGLER DOB: 1923/01/12 MRN: [id number]   Date of Service  10/26/2014  HPI/Events of Note  Pt made comfort with terminal extubation. Has increased secretions. Plan to transfer to hospice.  eICU Interventions  Scopolamine patch ordered.     Intervention Category Minor Interventions: Other:  Shir Bergman 10/26/2014, 8:04 PM

## 2014-10-26 NOTE — Procedures (Signed)
Extubation Procedure Note  Patient Details:   Name: [name] DOB: 1922/10/15 MRN: [id number]   Airway Documentation:  Airway 7.5 mm (Active)  Secured at (cm) 23 cm 10/26/2014  3:00 PM  Measured From Lips 10/26/2014  3:00 PM  Secured Location Right 10/26/2014  3:00 PM  Secured By [employer] 10/26/2014  3:00 PM  Tube Holder Repositioned Yes 10/26/2014  3:00 PM  Cuff Pressure (cm H2O) 22 cm H2O 10/26/2014  3:00 PM  Site Condition Dry 10/26/2014  1:11 PM    Evaluation  O2 sats: stable throughout and currently acceptable Complications: No apparent complications Patient did tolerate procedure well. Bilateral Breath Sounds: Rhonchi Suctioning: Airway   Miquel Dunn 10/26/2014, 4:33 PM

## 2014-10-26 NOTE — Progress Notes (Signed)
PULMONARY / CRITICAL CARE MEDICINE   Name: [name] MRN: [id number] DOB: 1922/05/26    ADMISSION DATE:  10/25/2014 CONSULTATION DATE:  10/10  REFERRING MD :  EDP  CHIEF COMPLAINT:  Altered mental status  INITIAL PRESENTATION: 81 y/o F with h/o HTN, dementia, paget's bone disease with difficulty swallowing and progressively worsening mental status this afternoon.  Intubated in ED for SpO2 in 70s and airway protection.  STUDIES:  CXR (10/10): tortuous trachea, ETT 3.8cm above carina CT Head (10/10): No acute intracranial abnormalities ABG (10/10): 7.342 / 44.1 / 339 / 24.2 EKG (10/10): Sinus tachycardia, nonspecific T wave abnormalities inferior leads  SIGNIFICANT EVENTS: 10/10 - intubation, admit to ICU  SUBJECTIVE: Patient continuing to wean on vasopressor requirement. No acute events overnight. Received 1 dose of fentanyl this morning & Precedex currently off.  REVIEW OF SYSTEMS:  Unable to obtain as patient is intubated and has baseline dementia.  VITAL SIGNS: Temp:  [97.5 F (36.4 C)-99.3 F (37.4 C)] 97.5 F (36.4 C) (10/12 0725) Pulse Rate:  [47-99] 59 (10/12 0700) Resp:  [14-25] 14 (10/12 0700) BP: (72-149)/(46-108) 149/73 mmHg (10/12 0700) SpO2:  [96 %-100 %] 100 % (10/12 0700) FiO2 (%):  [40 %] 40 % (10/12 0740) Weight:  [112 lb 3.4 oz (50.9 kg)] 112 lb 3.4 oz (50.9 kg) (10/12 0455) HEMODYNAMICS:   VENTILATOR SETTINGS: Vent Mode:  [-] PSV;CPAP FiO2 (%):  [40 %] 40 % Set Rate:  [14 bmp] 14 bmp Vt Set:  [450 mL] 450 mL PEEP:  [5 cmH20] 5 cmH20 Pressure Support:  [12 cmH20] 12 cmH20 Plateau Pressure:  [10 cmH20-21 cmH20] 15 cmH20 INTAKE / OUTPUT:  Intake/Output Summary (Last 24 hours) at 10/26/14 0828 Last data filed at 10/26/14 0700  Gross per 24 hour  Intake 3812.42 ml  Output    630 ml  Net 3182.42 ml    PHYSICAL EXAMINATION: General:  Comfortable. No acute distress.Laying in bed.  Integument:  Warm & dry. No rash on exposed skin.  HEENT:   Endotracheal tube in place. No scleral icterus or injection.PERRL. Cardiovascular:  Regular rhythm. No edema. No appreciable JVD.  Pulmonary:  Coarse breath sounds bilaterally. Tan secretions and subglottic suctioning. Symmetric chest wall rise on ventilator. Abdomen: Soft. Normal bowel sounds. Nondistended.  Neurological: Open his eyes to voice. Spontaneously moves all 4 extremities. Does not follow commands. Known baseline dementia.  LABS:  CBC  Recent Labs Lab 10/18/2014 2005 10/16/2014 2012 10/25/14 0545  WBC 11.3*  --  14.0*  HGB 14.7 15.3* 11.7*  HCT 42.5 45.0 33.5*  PLT 200  --  173   Coag's  Recent Labs Lab 11/12/2014 2005  APTT 33  INR 1.28   BMET  Recent Labs Lab 11/12/2014 2005 11/07/2014 2012 10/25/14 0545 10/26/14 0212  NA 133* 137 135 137  K 3.6 3.5 2.9* 3.8  CL 98* 100* 107 109  CO2 22  --  20* 16*  BUN 14 16 13 10   CREATININE 0.77 0.70 0.61 0.61  GLUCOSE 205* 211* 120* 121*   Electrolytes  Recent Labs Lab 10/29/2014 2005 10/25/14 0545 10/26/14 0212  CALCIUM 10.0 8.4* 9.3  MG  --  1.2* 1.7  PHOS  --  2.0* 1.8*   Sepsis Markers  Recent Labs Lab 10/25/14 0012 10/25/14 0014  LATICACIDVEN 2.3*  --   PROCALCITON  --  0.20   ABG  Recent Labs Lab 10/28/2014 2100  PHART 7.342*  PCO2ART 44.1  PO2ART 339.0*   Liver Enzymes  Recent Labs Lab 10/23/2014 2005 10/26/14 0212  AST 21  --   ALT 22  --   ALKPHOS 113  --   BILITOT 1.2  --   ALBUMIN 3.5 2.4*   Cardiac Enzymes  Recent Labs Lab 10/25/14 0014 10/25/14 0545 10/25/14 1106  TROPONINI 0.06* 0.06* 0.06*   Glucose  Recent Labs Lab 10/25/14 1314 10/25/14 1533 10/25/14 2014 10/25/14 2353 10/26/14 0359 10/26/14 0722  GLUCAP 138* 93 130* 100* 137* 103*    Imaging No results found.   ASSESSMENT / PLAN:  PULMONARY OETT 10/10 >> A:  Acute hypoxic respiratory failure - likely secondary to aspiration/aspiration pneumonia  P:   Vent bundle Continuing short course of  intubation with probable extubation tomorrow See ID section  CARDIOVASCULAR A:   HTN Septic Shock SVT - resolved RUE infiltration phenylephrine  P:  Wean pressors for MAP>65 Monitor patient on telemetry Follow R AC skin for evidence possible necrosis  Continue to defer CVC placement per the family's wishes Hold home antihypertensives   RENAL A:   Hypokalemia - Resolved  P:   Continue to monitor urine output with Foley catheter Trending electrolytes daily Continuing to monitor renal function with daily BUN/creatinine  GASTROINTESTINAL A:   Probable Aspiration  P:   Pepcid via tube bid Continue to hold tube feedings  HEMATOLOGIC A:  Leukocytosis - worsening Anemia - mild. No signs of active bleeding  P:  Trending Hgb & Leukocyte count daily w/ CBC Lovenox for DVT ppx  SCDs  INFECTIOUS A:   Aspiration pneumonia RLL  Possible UTI   P:   BCx x2 10/10 >> UC 10/10 >> Sputum 10/10 >>  Abx:  Unasyn, start date 10/10, day 3/7  ENDOCRINE A:   Hyperglycemia - no history of DM  P:   Accuchecks q4hr Low dose SSI  NEUROLOGIC A:   Acute encephalopathy - Likely secondary to aspiration/infection H/O Dementia   P:   RASS goal: 0 Neurology following Fentanyl/versed prn Precedex gtt  FAMILY  - Updates:  Patient's current clinical state discussed with her daughter via phone 10/11. - Inter-disciplinary family meet or Palliative Care meeting due by:  10/17  TODAY'S SUMMARY: 81 year old female with underlying dementia. Admitted with altered mentation & probable aspiration pneumonia. Continuing treatment of pneumonia. Patient remains grossly nonfocal and without evidence of abnormality on noncontrast CT of the head. Continuing to hold on further imaging at this time. Continuing to wean vasopressor support. Daughter is traveling into town today. Plan for possible one way extubation tomorrow depending upon family wishes.  I have spent a total of 31 minutes of  critical care time this morning caring for this patient, discussing the plan of care with her daughter, & reviewing the patient's electronic medical record.  Sonia Baller Ashok Cordia, M.D. [facility] Pager:  [phone] After 3pm or if no response, call [phone] 10/26/2014, 8:28 AM

## 2014-10-28 LAB — CULTURE, RESPIRATORY W GRAM STAIN

## 2014-10-28 LAB — CULTURE, RESPIRATORY

## 2014-10-30 LAB — CULTURE, BLOOD (ROUTINE X 2)
Culture: NO GROWTH
Culture: NO GROWTH

## 2014-11-15 NOTE — Progress Notes (Signed)
At 1215 am pt is noted to have labored breathing, this nurse called daughter to inform , at 1230 am pt noted to have no breathing, pulseless. Informed Dr. Ashby Dawes , time of death is 88am, [place] donor called pt is not potential for organ donotion due to age, referral no. [id number] by Corliss Blacker. Awaiting daughter for funeral service information.

## 2014-11-15 DEATH — deceased

## 2014-12-15 NOTE — Discharge Summary (Signed)
Death Note:  Complete accounting of the patient's history and physical on presentation please refer to the H&P dictated on 10/19/2014 by Dr. Lamonte Sakai. In brief patient is a an 81 year old female with known underlying dementia and difficulty swallowing. She was admitted on 10/10 and intubated in the emergency department for hypoxia. Treatment was initiated for septic shock secondary to aspiration pneumonia. The patient did have infiltration of her right upper extremity with phenylephrine. Multiple conversations were held with the patient's daughter via phone. Given her mother's advanced dementia and expressed wishes she was terminally extubated on 10/12 after her daughter arrived from traveling out of town. On my review the patient's hospital records she succumbed to her illness and past comfortably at 12:30 AM on 2014/10/31 pronounced by a nurse at bedside with no spontaneous breathing or pulse.  Diagnoses at Death: 1. Acute Hypoxic Respiratory Failure 2. Septic Shock 3. Acute Encephalopathy 4. Aspiration Pneumonia 5. Urinary Tract Infection 6. History of Dementia 7. Anemia 8. History of Dysphagia 9. Supraventricular Tachycardia

## 2016-09-15 IMAGING — CT CT HEAD W/O CM
2 series · 15 of 30 positions shown, 19 images · non-contrast
Comparison: CT dated 05/22/2013

CLINICAL DATA: 89-year-old female with dementia and confusion

EXAM:
CT HEAD WITHOUT CONTRAST
TECHNIQUE: Contiguous axial images were obtained from the base of the skull
through the vertex without intravenous contrast.

[Series 2: head w/o · axial · non-contrast · 0.40mm/px · z∈[+260,+395]mm · 13 of 33 slices shown, 17 images]
[im 3/33  brain]
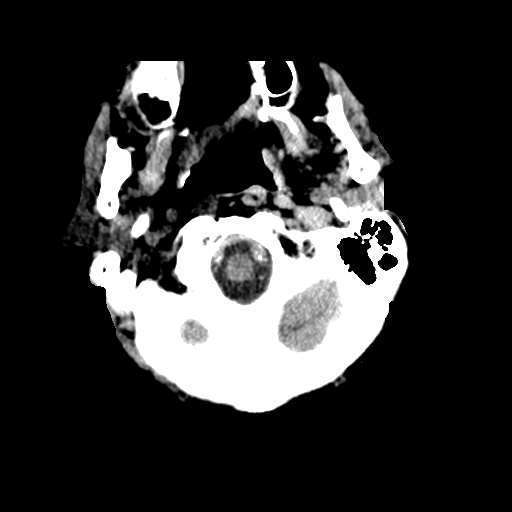
[im 3/33  bone]
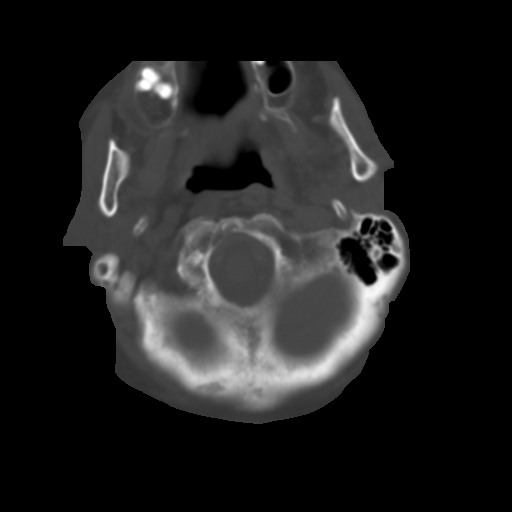
[im 5/33  brain]
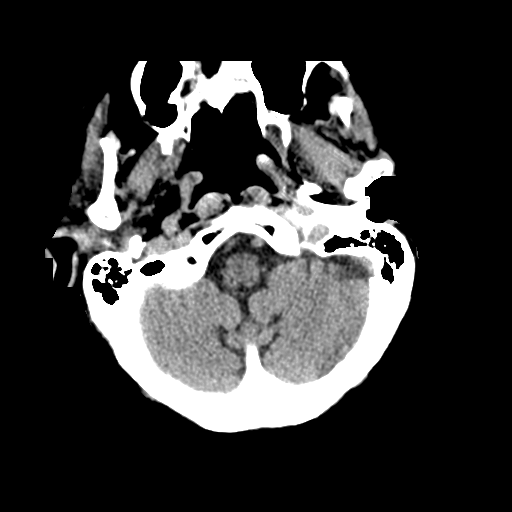
[im 7/33  brain]
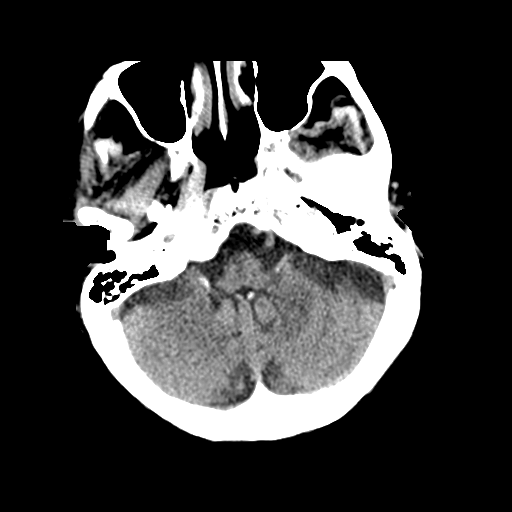
[im 10/33  brain]
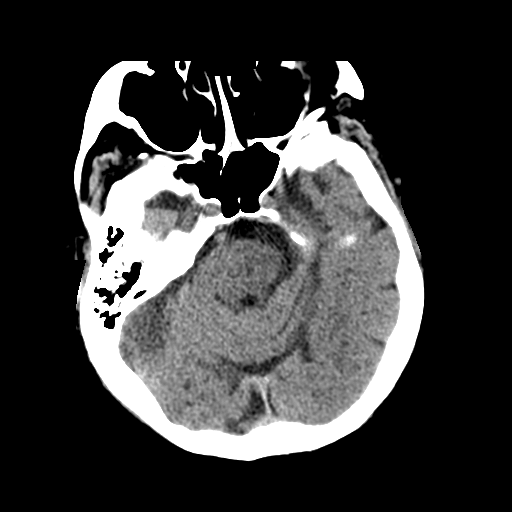
[im 12/33  brain]
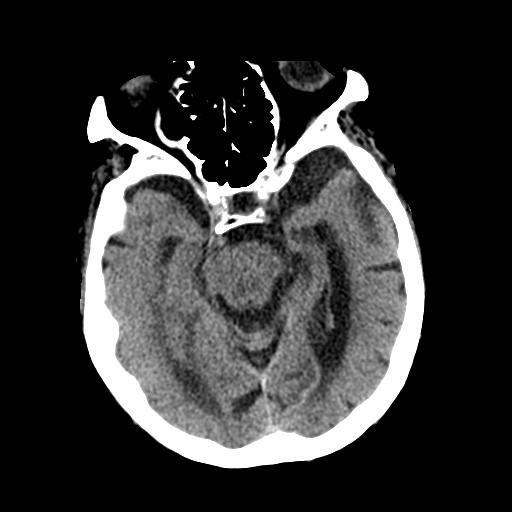
[im 12/33  bone]
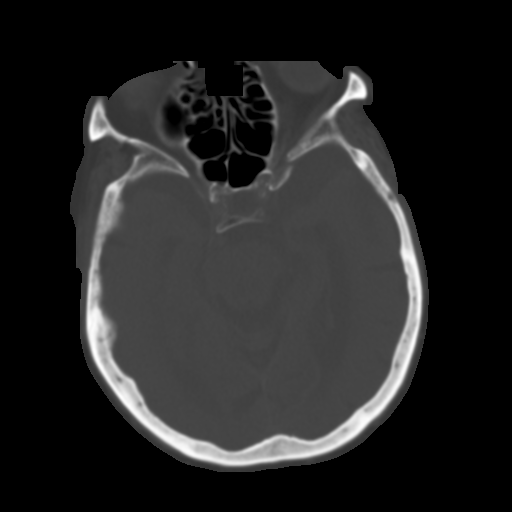
[im 14/33  brain]
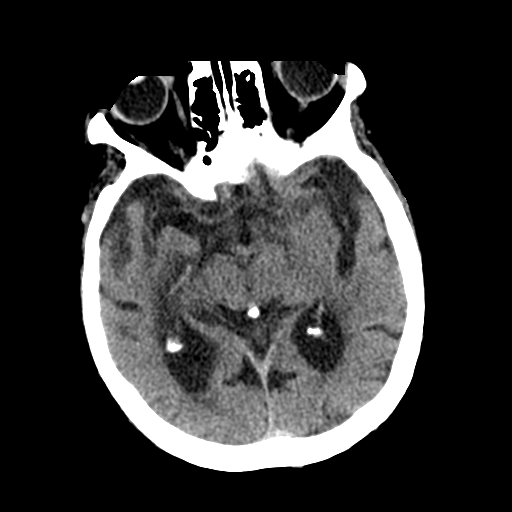
[im 17/33  brain]
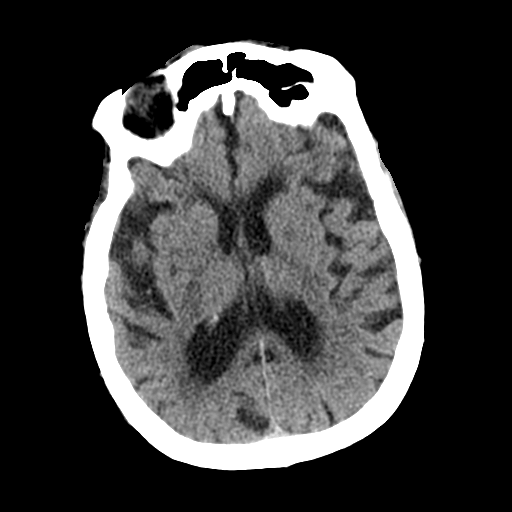
[im 19/33  brain]
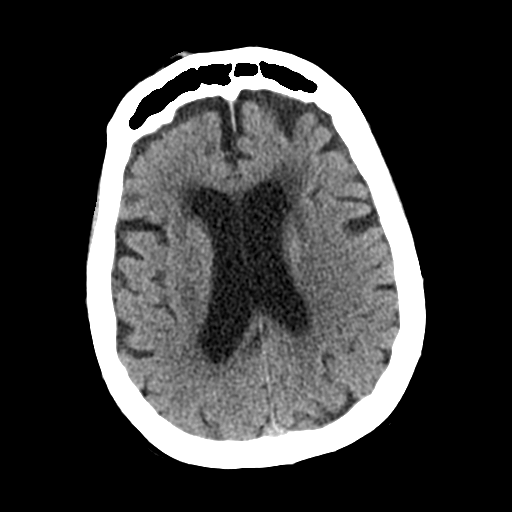
[im 21/33  brain]
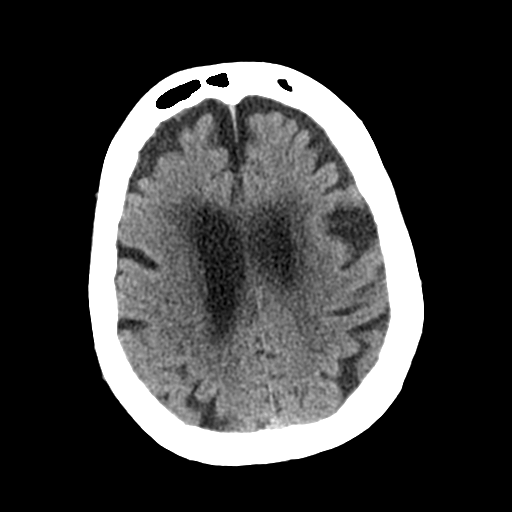
[im 21/33  bone]
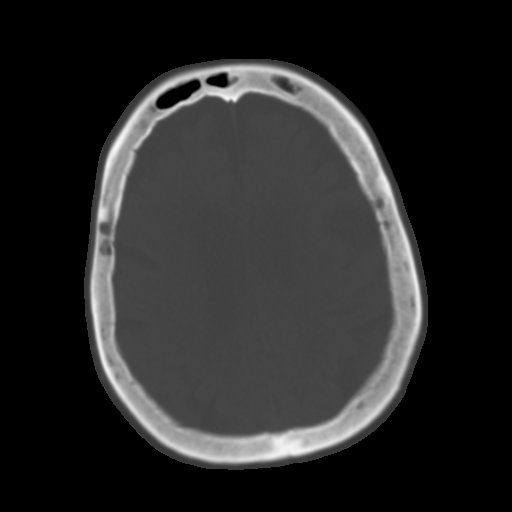
[im 23/33  brain]
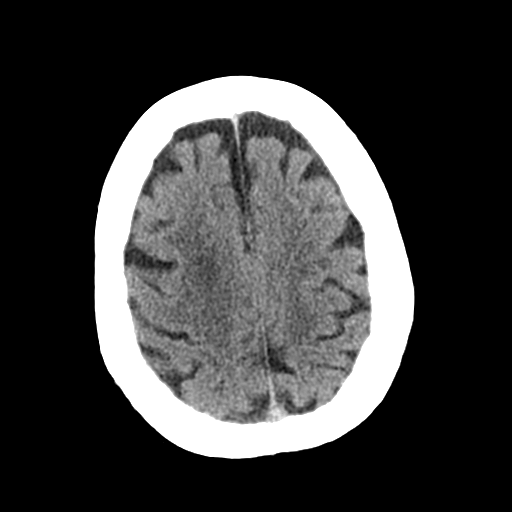
[im 26/33  brain]
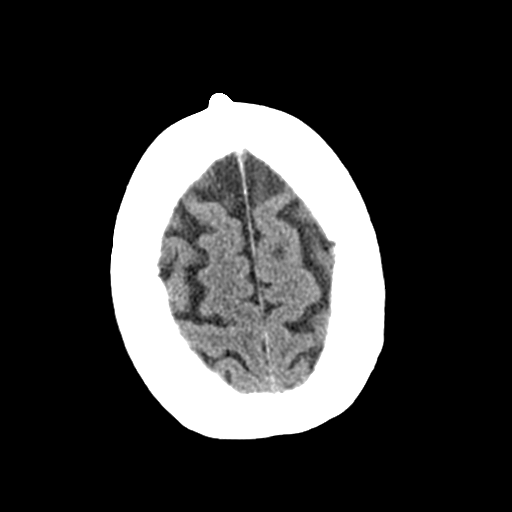
[im 28/33  brain]
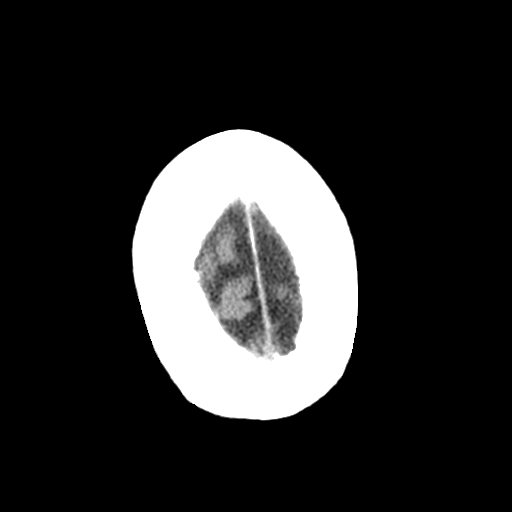
[im 30/33  brain]
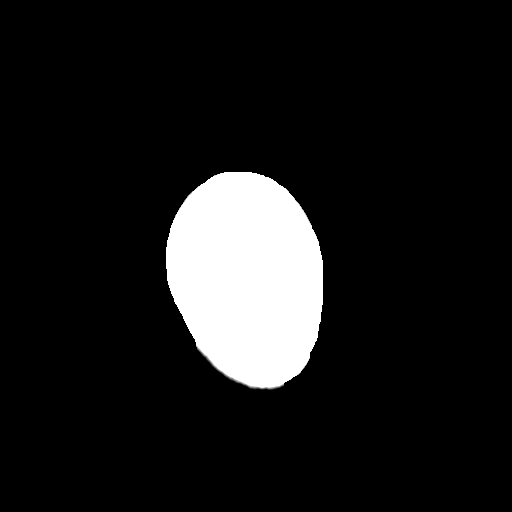
[im 30/33  bone]
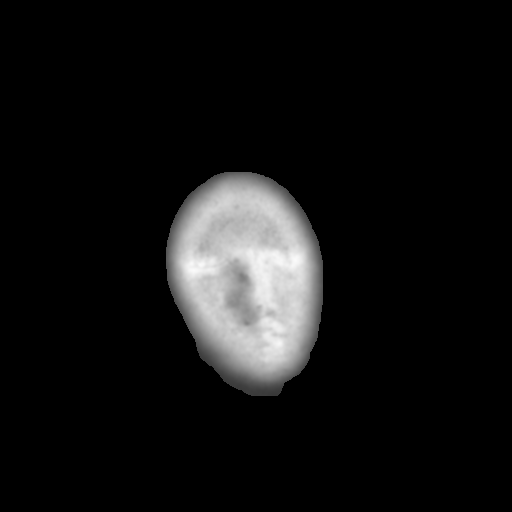

[Series 3: bone windows · axial · 0.40mm/px · z∈[+260,+280]mm · 2 of 33 slices shown]
[im 3/33  bone]
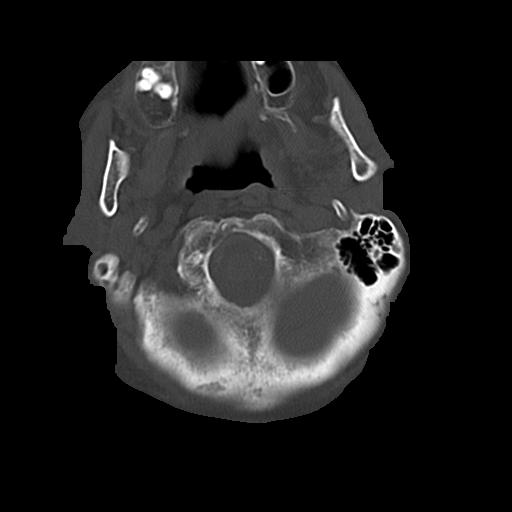
[im 7/33  bone]
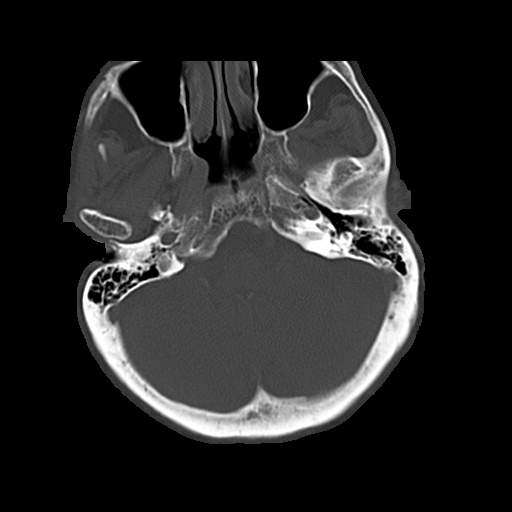

[15 of 30 positions shown; findings below may reference images not displayed]

FINDINGS: The ventricles are dilated and the sulci are prominent compatible
with age-related atrophy. Periventricular and deep white matter
hypodensities represent chronic microvascular ischemic changes.
There is no intracranial hemorrhage. No mass effect or midline shift
identified.

The visualized paranasal sinuses and mastoid air cells are well
aerated. The calvarium is intact. Stable left temporal calvarial
focal lucency is incompletely characterized. MRI may provide better
evaluation if clinically indicated.
IMPRESSION: No acute intracranial pathology.

Age-related atrophy and chronic microvascular ischemic disease.

If symptoms persist and there are no contraindications, MRI may
provide better evaluation if clinically indicated.

## 2016-12-08 IMAGING — CR DG CHEST 2V
2 series · 2 of 2 positions shown · non-contrast
Comparison: 07/29/2014

CLINICAL DATA: Fever, weakness and dyspnea.

EXAM:
CHEST  2 VIEW

[w chest lat]
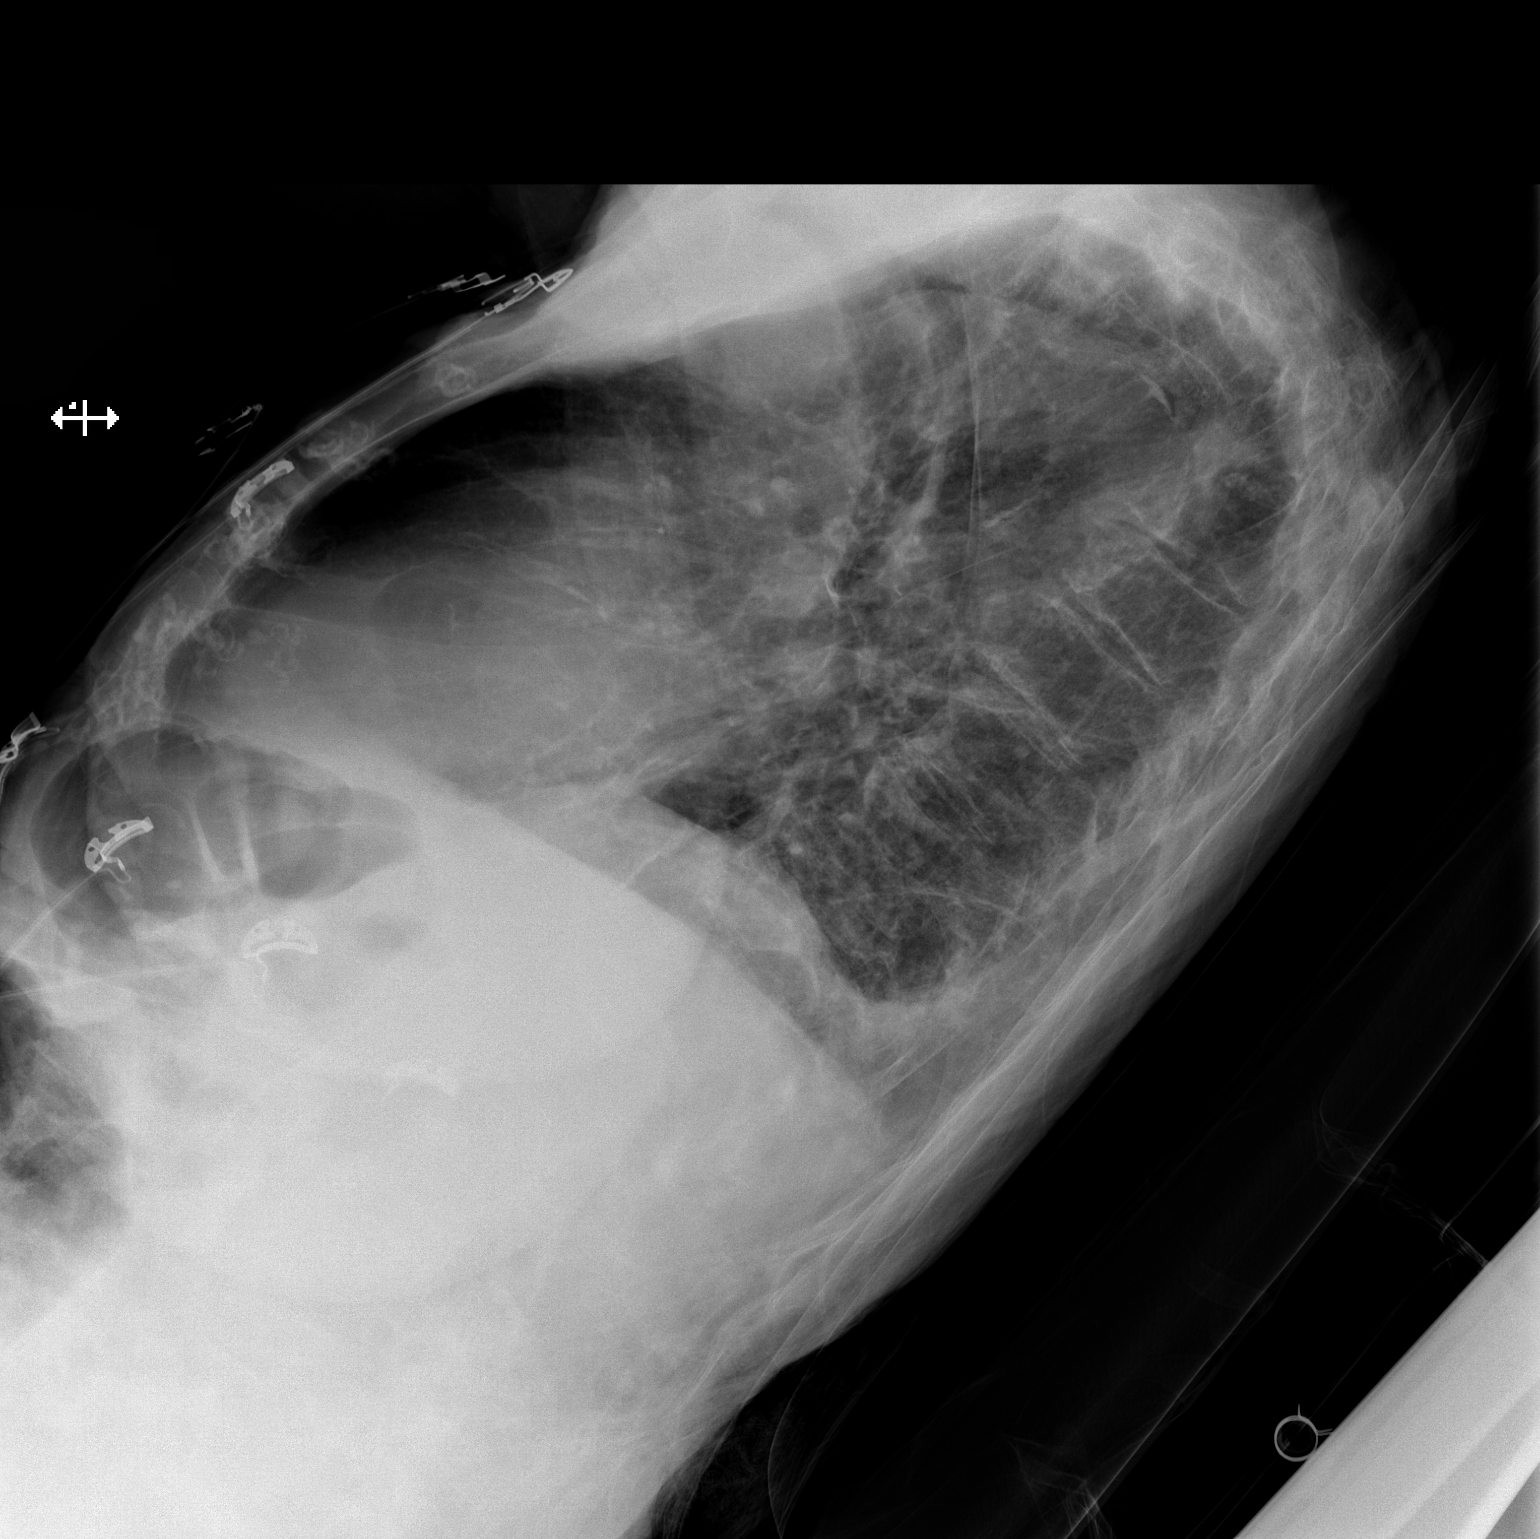

[x chest ap]
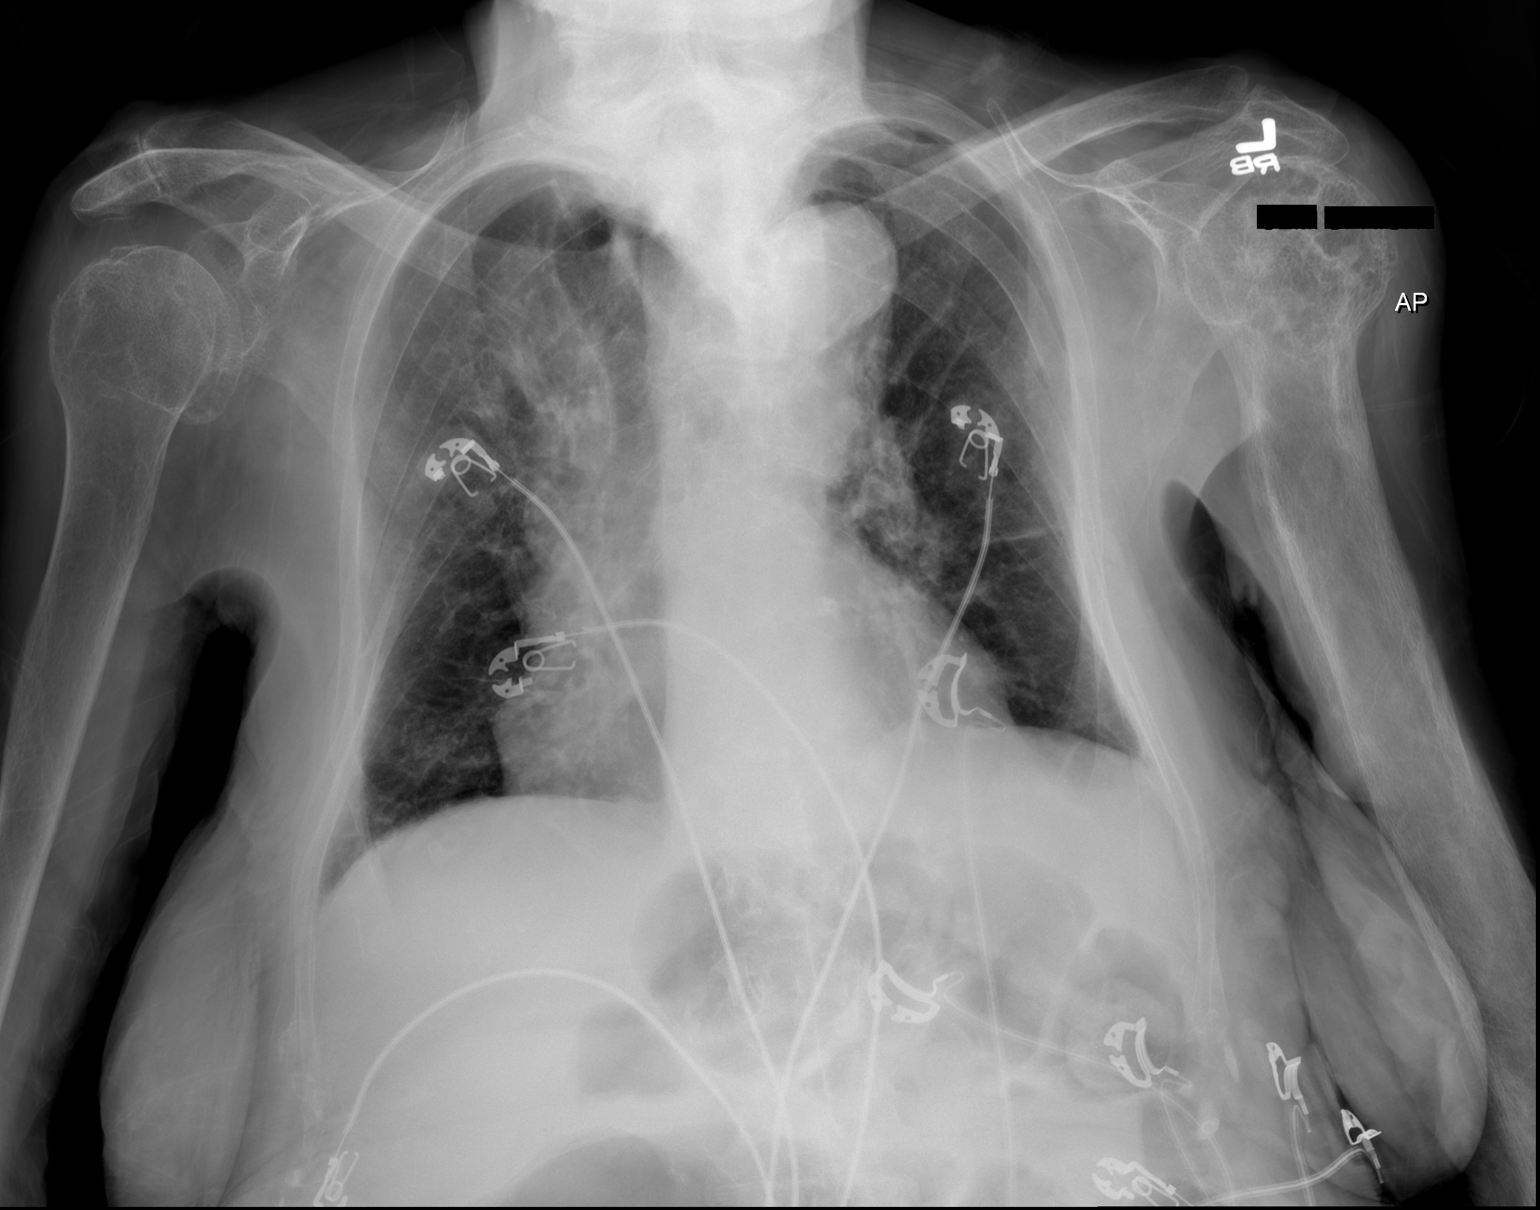

[2 of 2 positions shown; findings below may reference images not displayed]

FINDINGS: Go mild opacity in the upper right hemi thorax likely represents
superimposition of tortuous vasculature, ribs and scapula. The lungs
are grossly clear except minimal linear scarring or atelectasis.
There is no confluent airspace consolidation. There is no
significant effusion. Mild prominence of the interstitial markings
in the basilar periphery is unchanged over the past several
examinations and is likely chronic.

Incidentally noted Paget's disease of the proximal left humerus
without significant interval change.
IMPRESSION: No acute cardiopulmonary findings.
